# Patient Record
Sex: Female | Born: 1995 | Hispanic: Yes | Smoking: Never smoker
Health system: Southern US, Community
[De-identification: ages and names within clinical notes are randomized; demographics above are authoritative.]

## PROBLEM LIST (undated history)

## (undated) ENCOUNTER — Inpatient Hospital Stay (HOSPITAL_COMMUNITY): Payer: Self-pay

## (undated) DIAGNOSIS — N61 Mastitis without abscess: Secondary | ICD-10-CM

## (undated) DIAGNOSIS — R7611 Nonspecific reaction to tuberculin skin test without active tuberculosis: Secondary | ICD-10-CM

---

## 2014-01-25 ENCOUNTER — Encounter (HOSPITAL_COMMUNITY): Payer: Self-pay | Admitting: *Deleted

## 2014-01-25 ENCOUNTER — Emergency Department (HOSPITAL_COMMUNITY)
Admission: EM | Admit: 2014-01-25 | Discharge: 2014-01-25 | Disposition: A | Discharge status: Home / Self Care | Payer: Self-pay | Attending: Emergency Medicine | Admitting: Emergency Medicine

## 2014-01-25 DIAGNOSIS — Z3A24 24 weeks gestation of pregnancy: Secondary | ICD-10-CM | POA: Insufficient documentation

## 2014-01-25 DIAGNOSIS — O9989 Other specified diseases and conditions complicating pregnancy, childbirth and the puerperium: Secondary | ICD-10-CM | POA: Insufficient documentation

## 2014-01-25 DIAGNOSIS — O368120 Decreased fetal movements, second trimester, not applicable or unspecified: Secondary | ICD-10-CM | POA: Insufficient documentation

## 2014-01-25 DIAGNOSIS — R350 Frequency of micturition: Secondary | ICD-10-CM | POA: Insufficient documentation

## 2014-01-25 DIAGNOSIS — Z3490 Encounter for supervision of normal pregnancy, unspecified, unspecified trimester: Secondary | ICD-10-CM

## 2014-01-25 NOTE — ED Notes (Signed)
OB rapid response at bedside 

## 2014-01-25 NOTE — ED Notes (Signed)
Report received from ED, RN 

## 2014-01-25 NOTE — Progress Notes (Signed)
Pt is an 18 yo G2P1 previous c/section (in [place] with complaint of not feeling the baby move since yesterday. Pt had prenatal care in [place] until 2 months gestation. Has not had prenatal care in the [place]. [facility] but thinks her LMP was in March but she can not be any more specific with dates. States 1st pregnancy was delivered in [place] via c/section for "baby coming at 8 months" in 2012. Pt has very limited English but has friend at bedside helping to translate. Pt states she has not had anything to eat today. Baby is active now. FH via external monitor 145. No contractions noted on ext monitor or palpated. No ROM opr vag discharge.

## 2014-01-25 NOTE — ED Provider Notes (Signed)
CSN: [id number]     Arrival date & time 01/25/14  1045 History   First MD Initiated Contact with Patient 01/25/14 1053     Chief Complaint  Patient presents with  . Decreased Fetal Movement   HPI  Patient is an 18 y.o. G2P1 female who presents with the complaint of decreased fetal movements.  Patient thinks that she is approximately 6 months pregnant and has had no prenatal care since her confirmation examination.  Patient states that she has been feeling the baby move well until approximately 2 days ago.  She says that since that time she has not been feeling the baby move well.  She is also having some suprapubic pins and needles abdominal pain which she reports that she has been having frequently throughout her pregnancy.  Patient has had no vaginal bleeding, leakage of fluids, or contractions.  Patients previous pregnancy was in [place] and she had to have an emergency c-section performed due to fetal distress and decreased fetal movements.  Patient is taking a daily prenatal vitamin.  She has no other health problems.    History reviewed. No pertinent past medical history. Past Surgical History  Procedure Laterality Date  . Cesarean section      emergent at 8 mos.    History reviewed. No pertinent family history. History  Substance Use Topics  . Smoking status: Never Smoker   . Smokeless tobacco: Not on file  . Alcohol Use: No   OB History    Gravida Para Term Preterm AB TAB SAB Ectopic Multiple Living   2         1     Review of Systems  Constitutional: Negative for fever, chills and fatigue.  Respiratory: Negative for chest tightness and shortness of breath.   Cardiovascular: Negative for chest pain and palpitations.  Gastrointestinal: Negative for nausea, vomiting, diarrhea, constipation, blood in stool and anal bleeding.  Genitourinary: Positive for frequency. Negative for dysuria, urgency, hematuria, vaginal bleeding, vaginal discharge, difficulty urinating and vaginal  pain.  All other systems reviewed and are negative.     Allergies  Review of patient's allergies indicates no known allergies.  Home Medications   Prior to Admission medications   Medication Sig Start Date End Date Taking? Authorizing Provider  acetaminophen (TYLENOL) 325 MG tablet Take 325 mg by mouth every 8 (eight) hours as needed for headache.   Yes Historical Provider, MD  Prenatal Vit-Fe Fumarate-FA (PRENATAL PO) Take 1 tablet by mouth daily.   Yes Historical Provider, MD   BP 98/55 mmHg  Pulse 95  Temp(Src) 98.7 F (37.1 C) (Oral)  Resp 16  SpO2 99%  LMP 05/25/2013 Physical Exam  Constitutional: She is oriented to person, place, and time. She appears well-developed and well-nourished. No distress.  HENT:  Head: Normocephalic and atraumatic.  Mouth/Throat: Oropharynx is clear and moist. No oropharyngeal exudate.  Eyes: Conjunctivae and EOM are normal. Pupils are equal, round, and reactive to light. No scleral icterus.  Neck: Normal range of motion. Neck supple. No JVD present. No thyromegaly present.  Cardiovascular: Normal rate, regular rhythm, normal heart sounds and intact distal pulses.  Exam reveals no gallop and no friction rub.   No murmur heard. Pulmonary/Chest: Effort normal and breath sounds normal. No respiratory distress. She has no wheezes. She has no rales. She exhibits no tenderness.  Abdominal: Soft. Bowel sounds are normal. She exhibits no distension and no mass. There is no tenderness. There is no rebound and no guarding.  Gravid uterus with palpable uterine fundus.  Musculoskeletal: Normal range of motion.  Lymphadenopathy:    She has no cervical adenopathy.  Neurological: She is alert and oriented to person, place, and time. She has normal strength. No cranial nerve deficit or sensory deficit. Coordination normal.  Skin: Skin is warm and dry. She is not diaphoretic.  Psychiatric: She has a normal mood and affect. Her behavior is normal. Judgment and  thought content normal.  Nursing note and vitals reviewed.   ED Course  Procedures (including critical care time) Labs Review Labs Reviewed - No data to display  Imaging Review No results found.   EKG Interpretation None      MDM   Final diagnoses:  Pregnancy   Patient is an 18 y.o. Female who presents to the ED with decreased fetal movements.  Physical exam reveals gravid abdomen which is soft and non-tender.  Bedside ultrasound reveals fetal heart rate of 176 as performed by Dr. Gwendolyn GrantWalden.  Rapid OB was called and the patient was placed on the monitor.  Patient was observed for 30 minutes by Rapid OB with no evidence of contractions and good fetal heart rate.  Dr. Debroah LoopArnold was consulted via the rapid OB nurse who will discharge the patient and [facility]  Patient to be return for leakage of fluids, vaginal bleeding or any other concerning symptoms.  Patient also seen by Dr. Gwendolyn GrantWalden who agrees with the above plan and workup.      Eben Burowourtney A Forcucci, PA-C 01/25/14 1310

## 2014-01-25 NOTE — Progress Notes (Signed)
Pt eating Mal[place] sandwich and drinking apple juice. Baby active, FH 140 with spont accels and no decels. Sp[facility] to [facility]old [facility] . Pt to be discharged after eating. [facility]. Used [employer] line to speak to pt. She is aware of clinic appointment, and knows someone will be calling her to set up appointment. Pt has no questions or concerns at this time. Instructed pt in signs of labor, gave [facility].

## 2014-01-25 NOTE — ED Notes (Signed)
Per Marisue HumbleMaureen, RN OB rapid response advised [facility].  OK to discharge per Dr. Debroah LoopArnold.  Marisue HumbleMaureen, RN did all discharge instructions to pt via interpreter line.  PA made aware.

## 2014-01-25 NOTE — ED Notes (Signed)
Not felt baby move x 2 days; prev. Hx with first preg at 8 mos, whereby she had to have emergent c-section; pt. Have periumbilical pain - "pins and needles, and urinary frequency." she is 6 mos. Preg. Last time she saw an ob/gyn dr. Was during the first 2 mos. Of preg.

## 2014-01-25 NOTE — Discharge Instructions (Signed)
Evaluación de los movimientos fetales  °(Fetal Movement Counts) °Nombre del paciente: __________________________________________________ Fecha de parto estimada: ____________________ °La evaluación de los movimientos fetales es muy recomendable en los embarazos de alto riesgo, pero también es una buena idea que lo hagan todas las embarazadas. El médico le indicará que comience a contarlos a las 28 semanas de embarazo. Los movimientos fetales suelen aumentar:  °· Después de una comida completa. °· Después de la actividad física. °· Después de comer o beber algo dulce o frío. °· En reposo. °Preste atención cuando sienta que el bebé está más activo. Esto le ayudará a notar un patrón de ciclos de vigilia y sueño de su bebé y cuáles son los factores que contribuyen a un aumento de los movimientos fetales. Es importante llevar a cabo un recuento de movimientos fetales, al mismo tiempo cada día, cuando el bebé normalmente está más activo.  °CÓMO CONTAR LOS MOVIMIENTOS FETALES °1. Busque un lugar tranquilo y cómodo para sentarse o recostarse sobre el lado izquierdo. Al recostarse sobre su lado izquierdo, le proporciona una mejor circulación de sangre y oxígeno al bebé. °2. Anote el día y la hora en una hoja de papel o en un diario. °3. Comience contando las pataditas, revoloteos, chasquidos, vueltas o pinchazos en un período de 2 horas. Debe sentir al menos 10 movimientos en 2 horas. °4. Si no siente 10 movimientos en 2 horas, espere 2 ó 3 horas y cuente de nuevo. Busque cambios en el patrón o si no cuenta lo suficiente en 2 horas. °SOLICITE ATENCIÓN MÉDICA SI:  °· Siente menos de 10 pataditas en 2 horas, en dos intentos. °· No hay movimientos durante una hora. °· El patrón se modifica o le lleva más tiempo cada día contar las 10 pataditas. °· Siente que el bebé no se mueve como lo hace habitualmente. °Fecha: ____________ Movimientos: ____________ Hora de inicio: ____________ Hora de finalización: ____________  °Fecha:  ____________ Movimientos: ____________ Hora de inicio: ____________ Hora de finalización: ____________  °Fecha: ____________ Movimientos: ____________ Hora de inicio: ____________ Hora de finalización: ____________  °Fecha: ____________ Movimientos: ____________ Hora de inicio: ____________ Hora de finalización: ____________  °Fecha: ____________ Movimientos: ____________ Hora de inicio: ____________ Hora de finalización: ____________  °Fecha: ____________ Movimientos: ____________ Hora de inicio: ____________ Hora de finalización: ____________  °Fecha: ____________ Movimientos: ____________ Hora de inicio: ____________ Hora de finalización: ____________  °Fecha: ____________ Movimientos: ____________ Hora de inicio: ____________ Hora de finalización: ____________  °Fecha: ____________ Movimientos: ____________ Hora de inicio: ____________ Hora de finalización: ____________  °Fecha: ____________ Movimientos: ____________ Hora de inicio: ____________ Hora de finalización: ____________  °Fecha: ____________ Movimientos: ____________ Hora de inicio: ____________ Hora de finalización: ____________  °Fecha: ____________ Movimientos: ____________ Hora de inicio: ____________ Hora de finalización: ____________  °Fecha: ____________ Movimientos: ____________ Hora de inicio: ____________ Hora de finalización: ____________  °Fecha: ____________ Movimientos: ____________ Hora de inicio: ____________ Hora de finalización: ____________  °Fecha: ____________ Movimientos: ____________ Hora de inicio: ____________ Hora de finalización: ____________  °Fecha: ____________ Movimientos: ____________ Hora de inicio: ____________ Hora de finalización: ____________  °Fecha: ____________ Movimientos: ____________ Hora de inicio: ____________ Hora de finalización: ____________  °Fecha: ____________ Movimientos: ____________ Hora de inicio: ____________ Hora de finalización: ____________  °Fecha: ____________ Movimientos: ____________ Hora  de inicio: ____________ Hora de finalización: ____________  °Fecha: ____________ Movimientos: ____________ Hora de inicio: ____________ Hora de finalización: ____________  °Fecha: ____________ Movimientos: ____________ Hora de inicio: ____________ Hora de finalización: ____________  °Fecha: ____________ Movimientos: ____________ Hora de inicio: ____________ Hora de   finalización: ____________  °Fecha: ____________ Movimientos: ____________ Hora de inicio: ____________ Hora de finalización: ____________  °Fecha: ____________ Movimientos: ____________ Hora de inicio: ____________ Hora de finalización: ____________  °Fecha: ____________ Movimientos: ____________ Hora de inicio: ____________ Hora de finalización: ____________  °Fecha: ____________ Movimientos: ____________ Hora de inicio: ____________ Hora de finalización: ____________  °Fecha: ____________ Movimientos: ____________ Hora de inicio: ____________ Hora de finalización: ____________  °Fecha: ____________ Movimientos: ____________ Hora de inicio: ____________ Hora de finalización: ____________  °Fecha: ____________ Movimientos: ____________ Hora de inicio: ____________ Hora de finalización: ____________  °Fecha: ____________ Movimientos: ____________ Hora de inicio: ____________ Hora de finalización: ____________  °Fecha: ____________ Movimientos: ____________ Hora de inicio: ____________ Hora de finalización: ____________  °Fecha: ____________ Movimientos: ____________ Hora de inicio: ____________ Hora de finalización: ____________  °Fecha: ____________ Movimientos: ____________ Hora de inicio: ____________ Hora de finalización: ____________  °Fecha: ____________ Movimientos: ____________ Hora de inicio: ____________ Hora de finalización: ____________  °Fecha: ____________ Movimientos: ____________ Hora de inicio: ____________ Hora de finalización: ____________  °Fecha: ____________ Movimientos: ____________ Hora de inicio: ____________ Hora de finalización:  ____________  °Fecha: ____________ Movimientos: ____________ Hora de inicio: ____________ Hora de finalización: ____________  °Fecha: ____________ Movimientos: ____________ Hora de inicio: ____________ Hora de finalización: ____________  °Fecha: ____________ Movimientos: ____________ Hora de inicio: ____________ Hora de finalización: ____________  °Fecha: ____________ Movimientos: ____________ Hora de inicio: ____________ Hora de finalización: ____________  °Fecha: ____________ Movimientos: ____________ Hora de inicio: ____________ Hora de finalización: ____________  °Fecha: ____________ Movimientos: ____________ Hora de inicio: ____________ Hora de finalización: ____________  °Fecha: ____________ Movimientos: ____________ Hora de inicio: ____________ Hora de finalización: ____________  °Fecha: ____________ Movimientos: ____________ Hora de inicio: ____________ Hora de finalización: ____________  °Fecha: ____________ Movimientos: ____________ Hora de inicio: ____________ Hora de finalización: ____________  °Fecha: ____________ Movimientos: ____________ Hora de inicio: ____________ Hora de finalización: ____________  °Fecha: ____________ Movimientos: ____________ Hora de inicio: ____________ Hora de finalización: ____________  °Fecha: ____________ Movimientos: ____________ Hora de inicio: ____________ Hora de finalización: ____________  °Fecha: ____________ Movimientos: ____________ Hora de inicio: ____________ Hora de finalización: ____________  °Fecha: ____________ Movimientos: ____________ Hora de inicio: ____________ Hora de finalización: ____________  °Fecha: ____________ Movimientos: ____________ Hora de inicio: ____________ Hora de finalización: ____________  °Fecha: ____________ Movimientos: ____________ Hora de inicio: ____________ Hora de finalización: ____________  °Fecha: ____________ Movimientos: ____________ Hora de inicio: ____________ Hora de finalización: ____________  °Fecha: ____________  Movimientos: ____________ Hora de inicio: ____________ Hora de finalización: ____________  °Fecha: ____________ Movimientos: ____________ Hora de inicio: ____________ Hora de finalización: ____________  °Fecha: ____________ Movimientos: ____________ Hora de inicio: ____________ Hora de finalización: ____________  °Document Released: 06/18/2007 Document Revised: 02/26/2012 °[employer]® Patient Information ©2015 [employer]. This information is not intended to replace advice given to you by your health care provider. Make sure you discuss any questions you have with your health care provider. ° °

## 2014-01-25 NOTE — ED Notes (Signed)
Marisue HumbleMaureen, RN OB called with appt date/time.  [facility]  I will advise patient at discharge.

## 2014-02-02 ENCOUNTER — Encounter: Payer: Self-pay | Admitting: Advanced Practice Midwife

## 2014-02-02 ENCOUNTER — Ambulatory Visit (INDEPENDENT_AMBULATORY_CARE_PROVIDER_SITE_OTHER): Payer: Self-pay | Admitting: Advanced Practice Midwife

## 2014-02-02 VITALS — BP 106/64 | HR 84 | Temp 98.6°F | Wt 143.0 lb

## 2014-02-02 DIAGNOSIS — IMO0002 Reserved for concepts with insufficient information to code with codable children: Secondary | ICD-10-CM

## 2014-02-02 DIAGNOSIS — Z201 Contact with and (suspected) exposure to tuberculosis: Secondary | ICD-10-CM

## 2014-02-02 DIAGNOSIS — O34219 Maternal care for unspecified type scar from previous cesarean delivery: Secondary | ICD-10-CM

## 2014-02-02 DIAGNOSIS — O3421 Maternal care for scar from previous cesarean delivery: Secondary | ICD-10-CM

## 2014-02-02 DIAGNOSIS — O09899 Supervision of other high risk pregnancies, unspecified trimester: Secondary | ICD-10-CM | POA: Insufficient documentation

## 2014-02-02 DIAGNOSIS — O09892 Supervision of other high risk pregnancies, second trimester: Secondary | ICD-10-CM

## 2014-02-02 DIAGNOSIS — Z23 Encounter for immunization: Secondary | ICD-10-CM

## 2014-02-02 LAB — POCT URINALYSIS DIP (DEVICE)
BILIRUBIN URINE: NEGATIVE
GLUCOSE, UA: NEGATIVE mg/dL
HGB URINE DIPSTICK: NEGATIVE
Ketones, ur: NEGATIVE mg/dL
Nitrite: NEGATIVE
PH: 5.5 (ref 5.0–8.0)
Protein, ur: NEGATIVE mg/dL
Specific Gravity, Urine: 1.01 (ref 1.005–1.030)
Urobilinogen, UA: 0.2 mg/dL (ref 0.0–1.0)

## 2014-02-02 MED ORDER — TETANUS-DIPHTH-ACELL PERTUSSIS 5-2.5-18.5 LF-MCG/0.5 IM SUSP
0.5000 mL | Freq: Once | INTRAMUSCULAR | Status: AC
  Administered 2014-02-02: 0.5 mL via INTRAMUSCULAR

## 2014-02-02 NOTE — Progress Notes (Signed)
TB test administered 02/02/14. Patient advised to return Friday 02/04/14 between 9am-11am to have it read. Patient verbalized understanding and stated she would.

## 2014-02-02 NOTE — Progress Notes (Signed)
A: Pt. Here for first prenatal visit at 437w3d by LMP. [id number]. History of preterm delivery at 36 weeks via cesarean section for Oligohydramnios. She denies other complications in her previous pregnancy. She reports mild intermittent headaches at this point in her pregnancy, though she denies vision changes, nausea, vomiting, or abdominal pain. She denies dysuria or hematuria. She denies LOF, VB, Contractions. She reports good FM. She has no other medical problems, no other surgical history other than her previous c-section, No allergies, Does not take any medications at home other than tylenol, Lives at home with her husband and her mother in law who she says are both supportive, she does not smoke or drink alcohol. 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 She denies cough, night sweats, weight loss, or intermittent fevers at this point.   P:  - routine prenatal labs today - ppd for assessment of TB exposure due to lack of documentation.  - 1hr. GGT - Will schedule ultrasound today.  - Follow up in clinic in 2 weeks to review U/S, and test results.  - flu shot and Tdap today.

## 2014-02-02 NOTE — Progress Notes (Signed)
   Subjective:    Denise Avery is a G2P0101 1068w3d being seen today for her first obstetrical visit.  Her obstetrical history is significant for hx preterm delivery by C/S without labor for severe oligohydramnios.  Pt first delivery occurred [place] and was 34-36 weeks, pt is unsure.  Patient does intend to breast feed. Pregnancy history fully reviewed.  Pt had exposure to TB in [place] >1 year ago and had positive skin test shortly after this but with normal chest X-ray per pt.  Then, [place] the [place] She reports a negative PPD several months ago.  She denies fever, cough, recent weight loss, or night sweats.  Patient reports no complaints.  Filed Vitals:   02/02/14 0959  BP: 106/64  Pulse: 84  Temp: 98.6 F (37 C)  Weight: 143 lb (64.864 kg)    HISTORY: OB History  Gravida Para Term Preterm AB SAB TAB Ectopic Multiple Living  2 1 0 1 0 0 0 0 0 1     # Outcome Date GA Lbr Len/2nd Weight Sex Delivery Anes PTL Lv  2 Current           1 Preterm 12/05/10 2676w0d  4 lb (1.814 kg) F CS-Unspec Spinal  Y     Complications: Oligohydramnios     History reviewed. No pertinent past medical history. Past Surgical History  Procedure Laterality Date  . Cesarean section      emergent at 8 mos.    Family History  Problem Relation Age of Onset  . Diabetes Maternal Grandmother      Exam    Uterus:  Fundal Height: 26 cm  Pelvic Exam: Deferred r/t young age  System: Breast:  normal appearance, no masses or tenderness   Skin: normal coloration and turgor, no rashes    Neurologic: oriented, normal, gait normal; reflexes normal and symmetric   Extremities: normal strength, tone, and muscle mass, ROM of all joints is normal   HEENT neck supple with midline trachea, thyroid without masses and trachea midline   Mouth/Teeth mucous membranes moist, pharynx normal without lesions and dental hygiene good   Neck supple and no masses   Cardiovascular:    Respiratory:  appears well,  vitals normal, no respiratory distress, acyanotic, normal RR, ear and throat exam is normal, neck free of mass or lymphadenopathy   Abdomen: soft, non-tender; bowel sounds normal; no masses,  no organomegaly   Urinary: not evaluated      Assessment:    Pregnancy: G2P0101     1. Teen pregnancy   2. History of cesarean delivery affecting pregnancy   3. 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 Need for Tdap vaccination   5. Flu vaccine need       Plan:     Initial labs drawn. Prenatal vitamins. Problem list reviewed and updated. Genetic Screening discussed : late to care.  Ultrasound discussed; fetal survey: ordered.  Follow up in 2 weeks. 50% of 30 min visit spent on counseling and coordination of care.    I saw pt with resident and agree with note on 02/02/14.  LEFTWICH-KIRBY, Kalinda Romaniello 02/02/2014

## 2014-02-02 NOTE — Progress Notes (Signed)
Patient reports occasional sharp pain in belly

## 2014-02-03 ENCOUNTER — Ambulatory Visit (HOSPITAL_COMMUNITY)
Admission: RE | Admit: 2014-02-03 | Discharge: 2014-02-03 | Disposition: A | Discharge status: Home / Self Care | Payer: Self-pay | Source: Ambulatory Visit | Attending: Advanced Practice Midwife | Admitting: Advanced Practice Midwife

## 2014-02-03 DIAGNOSIS — IMO0002 Reserved for concepts with insufficient information to code with codable children: Secondary | ICD-10-CM

## 2014-02-03 DIAGNOSIS — Z3A26 26 weeks gestation of pregnancy: Secondary | ICD-10-CM | POA: Insufficient documentation

## 2014-02-03 DIAGNOSIS — Z1389 Encounter for screening for other disorder: Secondary | ICD-10-CM | POA: Insufficient documentation

## 2014-02-03 LAB — PRENATAL PROFILE (SOLSTAS)
ANTIBODY SCREEN: NEGATIVE
Basophils Absolute: 0 10*3/uL (ref 0.0–0.1)
Basophils Relative: 0 % (ref 0–1)
EOS ABS: 0.1 10*3/uL (ref 0.0–0.7)
EOS PCT: 1 % (ref 0–5)
HCT: 33.5 % — ABNORMAL LOW (ref 36.0–46.0)
HEMOGLOBIN: 11.6 g/dL — AB (ref 12.0–15.0)
HEP B S AG: NEGATIVE
HIV 1&2 Ab, 4th Generation: NONREACTIVE
Lymphocytes Relative: 17 % (ref 12–46)
Lymphs Abs: 1.9 10*3/uL (ref 0.7–4.0)
MCH: 30.5 pg (ref 26.0–34.0)
MCHC: 34.6 g/dL (ref 30.0–36.0)
MCV: 88.2 fL (ref 78.0–100.0)
MONOS PCT: 5 % (ref 3–12)
Monocytes Absolute: 0.5 10*3/uL (ref 0.1–1.0)
Neutro Abs: 8.4 10*3/uL — ABNORMAL HIGH (ref 1.7–7.7)
Neutrophils Relative %: 77 % (ref 43–77)
Platelets: 292 10*3/uL (ref 150–400)
RBC: 3.8 MIL/uL — AB (ref 3.87–5.11)
RDW: 14.3 % (ref 11.5–15.5)
RH TYPE: POSITIVE
RUBELLA: 6.64 {index} — AB (ref <0.90)
WBC: 10.9 10*3/uL — ABNORMAL HIGH (ref 4.0–10.5)

## 2014-02-03 LAB — PRESCRIPTION MONITORING PROFILE (19 PANEL)
AMPHETAMINE/METH: NEGATIVE ng/mL
Barbiturate Screen, Urine: NEGATIVE ng/mL
Benzodiazepine Screen, Urine: NEGATIVE ng/mL
Buprenorphine, Urine: NEGATIVE ng/mL
CANNABINOID SCRN UR: NEGATIVE ng/mL
CREATININE, URINE: 96.45 mg/dL (ref >20.0)
Carisoprodol, Urine: NEGATIVE ng/mL
Cocaine Metabolites: NEGATIVE ng/mL
FENTANYL URINE: NEGATIVE ng/mL
MDMA URINE: NEGATIVE ng/mL
Meperidine, Ur: NEGATIVE ng/mL
Methadone Screen, Urine: NEGATIVE ng/mL
Methaqualone: NEGATIVE ng/mL
Nitrites, Initial: NEGATIVE ug/mL
OXYCODONE SCRN UR: NEGATIVE ng/mL
Opiate Screen, Urine: NEGATIVE ng/mL
PH URINE, INITIAL: 5.4 pH (ref 4.5–8.9)
PHENCYCLIDINE, UR: NEGATIVE ng/mL
Propoxyphene: NEGATIVE ng/mL
TAPENTADOLUR: NEGATIVE ng/mL
Tramadol Scrn, Ur: NEGATIVE ng/mL
Zolpidem, Urine: NEGATIVE ng/mL

## 2014-02-03 LAB — GC/CHLAMYDIA PROBE AMP
CT Probe RNA: NEGATIVE
GC Probe RNA: NEGATIVE

## 2014-02-03 LAB — GLUCOSE TOLERANCE, 1 HOUR (50G) W/O FASTING: GLUCOSE 1 HOUR GTT: 93 mg/dL (ref 70–140)

## 2014-02-04 ENCOUNTER — Ambulatory Visit (HOSPITAL_COMMUNITY)
Admission: RE | Admit: 2014-02-04 | Discharge: 2014-02-04 | Disposition: A | Discharge status: Home / Self Care | Payer: Self-pay | Source: Ambulatory Visit | Attending: Advanced Practice Midwife | Admitting: Advanced Practice Midwife

## 2014-02-04 ENCOUNTER — Encounter: Payer: Self-pay | Admitting: General Practice

## 2014-02-04 DIAGNOSIS — R7611 Nonspecific reaction to tuberculin skin test without active tuberculosis: Secondary | ICD-10-CM

## 2014-02-04 LAB — HEMOGLOBINOPATHY EVALUATION
HEMOGLOBIN OTHER: 0 %
HGB A: 97.4 % (ref 96.8–97.8)
Hgb A2 Quant: 2.6 % (ref 2.2–3.2)
Hgb F Quant: 0 % (ref 0.0–2.0)
Hgb S Quant: 0 %

## 2014-02-04 LAB — CULTURE, OB URINE
Colony Count: NO GROWTH
Organism ID, Bacteria: NO GROWTH

## 2014-02-04 NOTE — Progress Notes (Signed)
Patient here today to have TB skin test read. Area of induration is 4.5x5cm slightly reddened and raised. Patient reports itching at site but denies productive cough, night sweats or fever. Chest x-ray ordered per protocol and patient walked upstairs to radiology.

## 2014-02-05 ENCOUNTER — Encounter (HOSPITAL_COMMUNITY): Payer: Self-pay | Admitting: *Deleted

## 2014-02-05 ENCOUNTER — Emergency Department (HOSPITAL_COMMUNITY)
Admission: EM | Admit: 2014-02-05 | Discharge: 2014-02-05 | Disposition: A | Discharge status: Home / Self Care | Payer: Self-pay | Attending: Emergency Medicine | Admitting: Emergency Medicine

## 2014-02-05 DIAGNOSIS — O26892 Other specified pregnancy related conditions, second trimester: Secondary | ICD-10-CM | POA: Insufficient documentation

## 2014-02-05 DIAGNOSIS — Z79899 Other long term (current) drug therapy: Secondary | ICD-10-CM | POA: Insufficient documentation

## 2014-02-05 DIAGNOSIS — Z3A26 26 weeks gestation of pregnancy: Secondary | ICD-10-CM | POA: Insufficient documentation

## 2014-02-05 DIAGNOSIS — R7611 Nonspecific reaction to tuberculin skin test without active tuberculosis: Secondary | ICD-10-CM | POA: Insufficient documentation

## 2014-02-05 HISTORY — DX: Nonspecific reaction to tuberculin skin test without active tuberculosis: R76.11

## 2014-02-05 MED ORDER — DIPHENHYDRAMINE HCL 25 MG PO CAPS
25.0000 mg | ORAL_CAPSULE | Freq: Once | ORAL | Status: AC
  Administered 2014-02-05: 25 mg via ORAL
  Filled 2014-02-05: qty 1

## 2014-02-05 MED ORDER — DIPHENHYDRAMINE HCL 25 MG PO CAPS: 25.0000 mg | ORAL_CAPSULE | Freq: Three times a day (TID) | ORAL | Status: DC | PRN

## 2014-02-05 MED ORDER — ACETAMINOPHEN 500 MG PO TABS
1000.0000 mg | ORAL_TABLET | Freq: Once | ORAL | Status: AC
  Administered 2014-02-05: 1000 mg via ORAL
  Filled 2014-02-05: qty 2

## 2014-02-05 NOTE — ED Provider Notes (Signed)
CSN: [id number]     Arrival date & time 02/05/14  0256 History   First MD Initiated Contact with Patient 02/05/14 [phone]     Chief Complaint  Patient presents with  . 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 It is roughly [redacted] weeks pregnant. Was seen on her first prenatal visit and had PPD placed on 11/11. Was read as positive on 11/13 with a diameter roughly 5 cm. It was itching at that point. She states she continues to have itching with pain that radiates up her arm. She's had no fever or chills. She denies any nausea or vomiting. Patient also had chest x-ray performed without any acute abnormalities. States that the redness around the site of the PPD placement has increased since it was read. Past Medical History  Diagnosis Date  . Positive TB test    Past Surgical History  Procedure Laterality Date  . Cesarean section      emergent at 8 mos.   . Cesarean section     Family History  Problem Relation Age of Onset  . Diabetes Maternal Grandmother    History  Substance Use Topics  . Smoking status: Never Smoker   . Smokeless tobacco: Never Used  . Alcohol Use: No   OB History    Gravida Para Term Preterm AB TAB SAB Ectopic Multiple Living   2 1 0 1 0 0 0 0 0 1      Review of Systems  Constitutional: Negative for fever and chills.  Respiratory: Negative for cough and shortness of breath.   Gastrointestinal: Negative for nausea and vomiting.  Musculoskeletal: Negative for back pain, neck pain and neck stiffness.  Skin: Positive for rash. Negative for wound.  Neurological: Negative for dizziness, weakness, light-headedness, numbness and headaches.  All other systems reviewed and are negative.     Allergies  Review of patient's allergies indicates no known allergies.  Home Medications   Prior to Admission medications    Medication Sig Start Date End Date Taking? Authorizing Provider  acetaminophen (TYLENOL) 325 MG tablet Take 325 mg by mouth every 8 (eight) hours as needed for headache.   Yes Historical Provider, MD  Prenatal Vit-Fe Fumarate-FA (PRENATAL PO) Take 1 tablet by mouth daily.    Historical Provider, MD   BP 101/54 mmHg  Pulse 87  Temp(Src) 97.7 F (36.5 C) (Oral)  Resp 20  Ht 4\' 9"  (1.448 m)  Wt 142 lb (64.411 kg)  BMI 30.72 kg/m2  SpO2 100%  LMP 08/01/2013 (Exact Date) Physical Exam  Constitutional: She is oriented to person, place, and time. She appears well-developed and well-nourished. No distress.  HENT:  Head: Normocephalic and atraumatic.  Mouth/Throat: Oropharynx is clear and moist.  Eyes: EOM are normal. Pupils are equal, round, and reactive to light.  Neck: Normal range of motion. Neck supple.  Cardiovascular: Normal rate and regular rhythm.   Pulmonary/Chest: Effort normal and breath sounds normal. No respiratory distress. She has no wheezes. She has no rales. She exhibits no tenderness.  Abdominal: Soft. Bowel sounds are normal. She exhibits no distension and no mass. There is no tenderness. There is no rebound and no guarding.  Musculoskeletal: Normal range of motion. She exhibits no edema or tenderness.  Neurological: She is alert and oriented to person, place, and time.  Skin: Skin is warm and dry. Rash noted. There is erythema.  Patient has a  1 cm in diameter area of induration surrounded by 6 cm in diameter area of erythema. Mild warmth. There is no fluctuance. No streaking.  Psychiatric: She has a normal mood and affect. Her behavior is normal.  Nursing note and vitals reviewed.   ED Course  Procedures (including critical care time) Labs Review Labs Reviewed - No data to display  Imaging Review Dg Chest 1 View  02/04/2014   CLINICAL DATA:  Positive TB skin test.  EXAM: CHEST - 1 VIEW  COMPARISON:  None.  FINDINGS: Mediastinum hilar structures normal. Lungs are  clear. Heart size normal. No pleural effusion or pneumothorax. Metallic densities noted over the right neck. This may be extrinsic to the patient.  IMPRESSION: No acute cardiopulmonary disease.   Electronically Signed   By: Maisie Fushomas  Register   On: 02/04/2014 12:29   [place] Ob Comp + 14 Wk  02/03/2014   OBSTETRICAL ULTRASOUND: This exam was performed within a [facility]. The OB US report was generated in the [facility] system, and faxed to the ordering physician.   This report is available in the [facility] [employer] PACS. See the [facility] Obstetric US report via the Image Link.    EKG Interpretation None      MDM   Final diagnoses:  None    Suspect this is likely just a positive PPD reaction. Patient had a negative chest x-ray. I doubt this is secondary infection. In the ED and have the patient return in 24 hours for reevaluation.the patient has been advised to take Tylenol and Benadryl as needed for pain and itching. She's been given return precautions and is voiced understanding    Loren Raceravid Annalisse Minkoff, MD 02/05/14 [phone]

## 2014-02-05 NOTE — ED Notes (Signed)
Pt had PPD TB test done Wednesday. Test was positive, [facility]., pt was not told the results. Pt reports pain at the site that radiates to right arm to right neck.

## 2014-02-05 NOTE — ED Notes (Signed)
Chest XR did not show any lesions on lungs. 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 Chest XR in [place] was also negative. Pt denies any symptoms for TB.

## 2014-02-05 NOTE — ED Notes (Signed)
Pt is also six months pregnant

## 2014-02-06 ENCOUNTER — Encounter (HOSPITAL_COMMUNITY): Payer: Self-pay | Admitting: Physical Medicine and Rehabilitation

## 2014-02-06 ENCOUNTER — Emergency Department (HOSPITAL_COMMUNITY)
Admission: EM | Admit: 2014-02-06 | Discharge: 2014-02-06 | Disposition: A | Discharge status: Home / Self Care | Payer: Self-pay | Attending: Emergency Medicine | Admitting: Emergency Medicine

## 2014-02-06 DIAGNOSIS — Z79899 Other long term (current) drug therapy: Secondary | ICD-10-CM | POA: Insufficient documentation

## 2014-02-06 DIAGNOSIS — O99712 Diseases of the skin and subcutaneous tissue complicating pregnancy, second trimester: Secondary | ICD-10-CM | POA: Insufficient documentation

## 2014-02-06 DIAGNOSIS — O9989 Other specified diseases and conditions complicating pregnancy, childbirth and the puerperium: Secondary | ICD-10-CM | POA: Insufficient documentation

## 2014-02-06 DIAGNOSIS — R7611 Nonspecific reaction to tuberculin skin test without active tuberculosis: Secondary | ICD-10-CM | POA: Insufficient documentation

## 2014-02-06 DIAGNOSIS — Z3A20 20 weeks gestation of pregnancy: Secondary | ICD-10-CM | POA: Insufficient documentation

## 2014-02-06 DIAGNOSIS — L02413 Cutaneous abscess of right upper limb: Secondary | ICD-10-CM | POA: Insufficient documentation

## 2014-02-06 NOTE — ED Provider Notes (Signed)
CSN: [id number]     Arrival date & time 02/06/14  1225 History  This chart was scribed for non-physician practitioner Santiago GladHeather Ameli Sangiovanni, PA-C working with Toy BakerAnthony T Allen, MD by Annye AsaAnna Dorsett, ED Scribe. This patient was seen in room TR04C/TR04C and the patient's care was started at 1:30 PM.    Chief Complaint  Patient presents with  . Abscess   The history is provided by the patient. A language interpreter was used.     HPI Comments: Denise Avery is a 18 y.o. female who presents to the Emergency Department for recheck. Patient explains that she was here yesterday to have her positive TB test checked; she was given prescriptions for Benadryl and was told to return today for further check. She reports that the area is less swollen than it was yesterday. She had a CXR yesterday that was negative.  She denies cough, night sweats, fever, chills, abdominal pain, vaginal discharge, vaginal bleeding.  She is currently [redacted] weeks pregnant.    [place] has been in the [place] about [place].  [place] moved [place] from [place]    Past Medical History  Diagnosis Date  . Positive TB test    Past Surgical History  Procedure Laterality Date  . Cesarean section      emergent at 8 mos.   . Cesarean section     Family History  Problem Relation Age of Onset  . Diabetes Maternal Grandmother    History  Substance Use Topics  . Smoking status: Never Smoker   . Smokeless tobacco: Never Used  . Alcohol Use: No   OB History    Gravida Para Term Preterm AB TAB SAB Ectopic Multiple Living   2 1 0 1 0 0 0 0 0 1      Review of Systems  Constitutional: Negative for fever and chills.  Gastrointestinal: Negative for nausea, vomiting and abdominal pain.  Genitourinary: Negative for vaginal discharge and vaginal pain.  All other systems reviewed and are negative.   Allergies  Review of patient's allergies indicates no known allergies.  Home Medications   Prior to Admission medications   Medication  Sig Start Date End Date Taking? Authorizing Provider  acetaminophen (TYLENOL) 325 MG tablet Take 325 mg by mouth every 8 (eight) hours as needed for headache.    Historical Provider, MD  diphenhydrAMINE (BENADRYL) 25 mg capsule Take 1 capsule (25 mg total) by mouth every 8 (eight) hours as needed for itching. 02/05/14   Loren Raceravid Yelverton, MD  Prenatal Vit-Fe Fumarate-FA (PRENATAL PO) Take 1 tablet by mouth daily.    Historical Provider, MD   BP 107/55 mmHg  Pulse 72  Temp(Src) 97.6 F (36.4 C) (Oral)  Resp 18  Ht 5' (1.524 m)  Wt 142 lb (64.411 kg)  BMI 27.73 kg/m2  SpO2 99%  LMP 08/01/2013 (Exact Date) Physical Exam  Constitutional: She is oriented to person, place, and time. She appears well-developed and well-nourished.  HENT:  Head: Normocephalic and atraumatic.  Neck: No tracheal deviation present.  Cardiovascular: Normal rate.   2+ radial pulses  Pulmonary/Chest: Effort normal.  Neurological: She is alert and oriented to person, place, and time.  Distal sensation intact in all five fingers on the right hand  Skin: Skin is warm and dry.  20 mm area of erythema and induration on the right forearm; No erythema or edema surrounding this.  no erythematous streaking.  Area of erythema significantly improved when compared to the area that was marked with skin  marking pen yesterday.  Psychiatric: She has a normal mood and affect. Her behavior is normal.  Nursing note and vitals reviewed.   ED Course  Procedures   DIAGNOSTIC STUDIES: Oxygen Saturation is 99% on RA, normal by my interpretation.    COORDINATION OF CARE: 1:40 PM Discussed treatment plan with pt at bedside and pt agreed to plan.   Labs Review Labs Reviewed - No data to display  Imaging Review No results found.   EKG Interpretation None     2:01 PM Discussed with Infectious Disease.  They recommend having the patient follow up at the [facility].  Nothing further to do in the ED. MDM   Final  diagnoses:  None   Patient presenting with a positive PPD test.  She had a CXR yesterday that was negative.  Patient is currently pregnant, but denies abdominal pain, vaginal bleeding, or vaginal discharge.  Discussed PPD results with ID.  They recommend having the patient follow up with the Health Department and do not feel that there is anything further to do in the ED.  Patient stable for discharge.  Return precautions given,     Santiago GladHeather [place] Kreitzer, PA-C 02/06/14 2204  Toy BakerAnthony T Allen, MD 02/07/14 [phone]

## 2014-02-06 NOTE — ED Notes (Addendum)
Pt was seen yesterday for positive TB test, had chest x-ray performed, was negative. Pt returns to ED today for severe itching and discomfort at site. Red rash noted to inner R forearm. Pt does not speak english.

## 2014-02-10 ENCOUNTER — Encounter: Payer: Self-pay | Admitting: Advanced Practice Midwife

## 2014-02-10 DIAGNOSIS — Z201 Contact with and (suspected) exposure to tuberculosis: Secondary | ICD-10-CM | POA: Insufficient documentation

## 2014-02-16 ENCOUNTER — Ambulatory Visit (INDEPENDENT_AMBULATORY_CARE_PROVIDER_SITE_OTHER): Payer: Self-pay | Admitting: Physician Assistant

## 2014-02-16 VITALS — BP 103/63 | HR 79 | Temp 97.8°F | Wt 145.6 lb

## 2014-02-16 DIAGNOSIS — IMO0002 Reserved for concepts with insufficient information to code with codable children: Secondary | ICD-10-CM

## 2014-02-16 LAB — POCT URINALYSIS DIP (DEVICE)
Bilirubin Urine: NEGATIVE
GLUCOSE, UA: NEGATIVE mg/dL
HGB URINE DIPSTICK: NEGATIVE
Ketones, ur: NEGATIVE mg/dL
NITRITE: NEGATIVE
PH: 5.5 (ref 5.0–8.0)
Protein, ur: NEGATIVE mg/dL
Specific Gravity, Urine: 1.01 (ref 1.005–1.030)
UROBILINOGEN UA: 0.2 mg/dL (ref 0.0–1.0)

## 2014-02-16 MED ORDER — PRENATAL VITAMINS PLUS 27-1 MG PO TABS: 1.0000 | ORAL_TABLET | Freq: Every day | ORAL | Status: DC

## 2014-02-16 NOTE — Patient Instructions (Signed)
Informacin sobre el parto prematuro  (Preterm Labor Information)  Se llama parto prematuro cuando se inicia antes de las 37 semanas de embarazo. La duracin de un embarazo normal es de 39 a 41 semanas.  CAUSAS  Generalmente las causas del parto prematuro no se conocen. La causa ms frecuente conocida es una infeccin.  FACTORES DE RIESGO   Historia previa de parto prematuro.  Romper la bolsa de aguas antes de tiempo.  La placenta cubre la abertura del cuello.  La placenta se despega del tero.  El cuello es demasiado dbil para contener al beb en el tero.  Hay mucho lquido en el saco amnitico.  Consumo de drogas o hbito de fumar durante el embarazo.  No aumentar de peso lo suficiente durante el embarazo.  Mujeres menores de 18 aos o mayores de 35 aos.  Tener bajos ingresos.  Pertenecer a la raza afroamericana. SNTOMAS   Clicos similares a los menstruales, dolor en el vientre (abdominal) o dolor en la espalda.  Contracciones regulares, tan frecuentes como seis en una hora. Pueden ser suaves o dolorosas.  Contracciones que comienzan en la parte superior del vientre. Luego bajan hacia la zona inferior del vientre y hacia la espalda.  Presin en la zona inferior del vientre que parece empeorar.  Sangrado que proviene de la vagina.  Prdida de lquido por la vagina. TRATAMIENTO  El tratamiento depende de:   Su estado.  El estado del beb.  Cuntas semanas tiene de embarazo. El mdico podr indicarle:   Medicamentos para detener las contracciones.  Que permanezca en la cama excepto para ir al bao (reposo en cama).  Que permanezca en el hospital. QU DEBE HACER SI PIENSA QUE EST EN TRABAJO DE PARTO PREMATURO?  Comunquese con su mdico de inmediato. Debe concurrir al hospital para ser controlada inmediatamente.  CMO PUEDE EVITAR EL TRABAJO DE PARTO PREMATURO EN FUTUROS EMBARAZOS?   Si fuma, abandone el hbito.  Mantenga un aumento de peso  saludable.  Notome drogas ni manipule sustancias qumicas que no necesita.  Informe a su mdico si piensa que tiene una infeccin.  Informe a su mdico si tuvo un trabajo de parto prematuro anteriormente. Document Released: 04/13/2010 Document Revised: 11/11/2012 [employer] Patient Information 2015 [employer]. This information is not intended to replace advice given to you by your health care provider. Make sure you discuss any questions you have with your health care provider.  

## 2014-02-16 NOTE — Progress Notes (Signed)
Used Spanish interpreter Raquel

## 2014-02-16 NOTE — Progress Notes (Signed)
28 weeks, no complaints.  Denies LOF, vaginal bleeding, dysuria.  Endorses good fetal movement PTL precautions reviewed RTC 2 weeks

## 2014-02-18 LAB — URINE CULTURE: Colony Count: 30000

## 2014-03-02 ENCOUNTER — Ambulatory Visit (INDEPENDENT_AMBULATORY_CARE_PROVIDER_SITE_OTHER): Payer: Self-pay | Admitting: Physician Assistant

## 2014-03-02 VITALS — BP 107/56 | HR 79 | Temp 97.7°F | Wt 145.7 lb

## 2014-03-02 DIAGNOSIS — Z3492 Encounter for supervision of normal pregnancy, unspecified, second trimester: Secondary | ICD-10-CM

## 2014-03-02 LAB — POCT URINALYSIS DIP (DEVICE)
Bilirubin Urine: NEGATIVE
Glucose, UA: NEGATIVE mg/dL
HGB URINE DIPSTICK: NEGATIVE
Ketones, ur: NEGATIVE mg/dL
Nitrite: NEGATIVE
PROTEIN: NEGATIVE mg/dL
UROBILINOGEN UA: 0.2 mg/dL (ref 0.0–1.0)
pH: 5 (ref 5.0–8.0)

## 2014-03-02 NOTE — Patient Instructions (Signed)
Segundo trimestre de embarazo (Second Trimester of Pregnancy) El segundo trimestre va desde la semana13 hasta la 28, desde el cuarto hasta el sexto mes, y suele ser el momento en el que mejor se siente. Su organismo se ha adaptado a estar embarazada y comienza a sentirse fsicamente mejor. En general, las nuseas matutinas han disminuido o han desaparecido completamente, p El segundo trimestre es tambin la poca en la que el feto se desarrolla rpidamente. Hacia el final del sexto mes, el feto mide aproximadamente 9pulgadas (23cm) y pesa alrededor de 1 libras (700g). Es probable que sienta que el beb se mueve (da pataditas) entre las 18 y 20semanas del embarazo. CAMBIOS EN EL ORGANISMO Su organismo atraviesa por muchos cambios durante el embarazo, y estos varan de una mujer a otra.  Seguir aumentando de peso. Notar que la parte baja del abdomen sobresale. Podrn aparecer las primeras estras en las caderas, el abdomen y las mamas. Es posible que tenga dolores de cabeza que pueden aliviarse con los medicamentos que su mdico autorice. Tal vez tenga necesidad de orinar con ms frecuencia porque el feto est ejerciendo presin sobre la vejiga. Debido al embarazo podr sentir acidez estomacal con frecuencia. Puede estar estreida, ya que ciertas hormonas enlentecen los movimientos de los msculos que empujan los desechos a travs de los intestinos. Pueden aparecer hemorroides o abultarse e hincharse las venas (venas varicosas). Puede tener dolor de espalda que se debe al aumento de peso y a que las hormonas del embarazo relajan las articulaciones entre los huesos de la pelvis, y como consecuencia de la modificacin del peso y los msculos que mantienen el equilibrio. Las mamas seguirn creciendo y le dolern. Las encas pueden sangrar y estar sensibles al cepillado y al hilo dental. Pueden aparecer zonas oscuras o manchas (cloasma, mscara del embarazo) en el rostro que probablemente se  atenuarn despus del nacimiento del beb. Es posible que se forme una lnea oscura desde el ombligo hasta la zona del pubis (linea nigra) que probablemente se atenuarn despus del nacimiento del beb. Tal vez haya cambios en el cabello que pueden incluir su engrosamiento, crecimiento rpido y cambios en la textura. Adems, a algunas mujeres se les cae el cabello durante o despus del embarazo, o tienen el cabello seco o fino. Lo ms probable es que el cabello se le normalice despus del nacimiento del beb. QU DEBE ESPERAR EN LAS CONSULTAS PRENATALES Durante una visita prenatal de rutina: La pesarn para asegurarse de que usted y el feto estn creciendo normalmente. Le tomarn la presin arterial. Le medirn el abdomen para controlar el desarrollo del beb. Se escucharn los latidos cardacos fetales. Se evaluarn los resultados de los estudios solicitados en visitas anteriores. El mdico puede preguntarle lo siguiente: Cmo se siente. Si siente los movimientos del beb. Si ha tenido sntomas anormales, como prdida de lquido, sangrado, dolores de cabeza intensos o clicos abdominales. Si tiene alguna pregunta. Otros estudios que podrn realizarse durante el segundo trimestre incluyen lo siguiente: Anlisis de sangre para detectar: Concentraciones de hierro bajas (anemia). Diabetes gestacional (entre la semana 24 y la 28). Anticuerpos Rh. Anlisis de orina para detectar infecciones, diabetes o protenas en la orina. Una ecografa para confirmar que el beb crece y se desarrolla correctamente. Una amniocentesis para diagnosticar posibles problemas genticos. Estudios del feto para descartar espina bfida y sndrome de Down. INSTRUCCIONES PARA EL CUIDADO EN EL HOGAR  Evite fumar, consumir hierbas, beber alcohol y tomar frmacos que no le hayan recetado. Estas sustancias qumicas   afectan la formacin y el desarrollo del beb. Siga las indicaciones del mdico en relacin con el uso de  medicamentos. Durante el embarazo, hay medicamentos que son seguros de tomar y otros que no. Haga actividad fsica solo en la forma indicada por el mdico. Sentir clicos uterinos es un buen signo para detener la actividad fsica. Contine comiendo alimentos que sanos con regularidad. Use un sostn que le brinde buen soporte si le duelen las mamas. No se d baos de inmersin en agua caliente, baos turcos ni saunas. Colquese el cinturn de seguridad cuando conduzca. No coma carne cruda ni queso sin cocinar; evite el contacto con las bandejas sanitarias de los gatos y la tierra que estos animales usan. Estos elementos contienen grmenes que pueden causar defectos congnitos en el beb. Tome las vitaminas prenatales. Si est estreida, pruebe un laxante suave (si el mdico lo autoriza). Consuma ms alimentos ricos en fibra, como vegetales y frutas frescos y cereales integrales. Beba gran cantidad de lquido para mantener la orina de tono claro o color amarillo plido. Dese baos de asiento con agua tibia para aliviar el dolor o las molestias causadas por las hemorroides. Use una crema para las hemorroides si el mdico la autoriza. Si tiene venas varicosas, use medias de descanso. Eleve los pies durante 15minutos, 3 o 4veces por da. Limite la cantidad de sal en su dieta. No levante objetos pesados, use zapatos de tacones bajos y mantenga una buena postura. Descanse con las piernas elevadas si tiene calambres o dolor de cintura. Visite a su dentista si an no lo ha hecho durante el embarazo. Use un cepillo de dientes blando para higienizarse los dientes y psese el hilo dental con suavidad. Puede seguir manteniendo relaciones sexuales, a menos que el mdico le indique lo contrario. Concurra a todas las visitas prenatales segn las indicaciones de su mdico. SOLICITE ATENCIN MDICA SI:  Tiene mareos. Siente clicos leves, presin en la pelvis o dolor persistente en el abdomen. Tiene nuseas,  vmitos o diarrea persistentes. Tiene secrecin vaginal con mal olor. Siente dolor al orinar. SOLICITE ATENCIN MDICA DE INMEDIATO SI:  Tiene fiebre. Tiene una prdida de lquido por la vagina. Tiene sangrado o pequeas prdidas vaginales. Siente dolor intenso o clicos en el abdomen. Sube o baja de peso rpidamente. Tiene dificultad para respirar y siente dolor de pecho. Sbitamente se le hinchan mucho el rostro, las manos, los tobillos, los pies o las piernas. No ha sentido los movimientos del beb durante una hora. Siente un dolor de cabeza intenso que no se alivia con medicamentos. Hay cambios en la visin. Document Released: 12/19/2004 Document Revised: 03/16/2013 [employer] Patient Information 2015 [employer]. This information is not intended to replace advice given to you by your health care provider. Make sure you discuss any questions you have with your health care provider. Acidez de estmago durante el embarazo  (Heartburn During Pregnancy ) La acidez es la sensacin de ardor en el pecho que se siente cuando el cido del estmago vuelve haca el esfago. La acidez es frecuente en el embarazo debido a la liberacin de cierta hormona (progesterona). La progesterona relaja la vlvula que separa el esfago del estmago. Esto hace que el cido suba al esfago y cause acidez. Tambin puede ocurrir en el embarazo debido a que el tero al agrandarse empuja el estmago, lo que hace que suba ms cido al esfago. Esto se produce especialmente en las ltimas etapas del embarazo. La acidez generalmente desaparece despus del parto. CAUSAS  La   acidez se siente cuando el cido del estmago vuelve hacia el esfago. Durante el embarazo, puede ser causada por distintas cosas, por ejemplo:  La hormona progesterona. Cambios en los niveles hormonales. El tero crece y empuja el cido del estmago hacia arriba. Comidas abundantes Ciertos alimentos y bebidas Haga actividad fsica. Aumento en la  produccin de cido SIGNOS Y SNTOMAS  Sensacin de ardor en el pecho o en la parte inferior de la garganta. Sabor amargo en la boca. Tos. DIAGNSTICO  El mdico diagnostica la acidez con una historia clnica cuidadosa en la que pregunta por sus molestias. Le indicar anlisis de sangre para encontrar cierto tipo de bacteria que se asocia con la acidez. En algunos casos se diagnostica recetando un medicamento para calmar la acidez y viendo si los sntomas mejoran. En algunos casos, se realiza un procedimiento llamado endoscopa. En este procedimiento se usa un tubo con una luz y una cmara en un extremo (endoscopio) , y se examina el esfago y el estmago. TRATAMIENTO  El tratamiento variar segn la gravedad de los sntomas. El mdico podr indicar: Medicamentos de venta libre (anticidos, medicamentos para disminuir la acidez) en los casos de acidez leve. Medicamentos recetados para disminuir el cido estomacal o para proteger la superficie del estmago. Ciertos cambios en la dieta. Elevacin de la cabecera de la cama colocando bloques debajo de las patas. De esta manera evitar que el cido del estmago vuelva al esfago mientras est recostado. INSTRUCCIONES PARA EL CUIDADO EN EL HOGAR  Tome slo medicamentos de venta libre o recetados, segn las indicaciones del mdico. Eleve la cabecera de la cama colocando bloques debajo de las patas, si el mdico lo aconsej. Usar ms almohadas al dormir no es efectivo porque solo modificara la posicin de la cabeza. No  haga ejercicios enseguida despus de comer. Evite comer 2 o 3horas antes de irse a dormir. No se acueste enseguida despus de comer. Haga comidas pequeas durante el da en lugar de tres comidas abundantes. Identifique los alimentos o las bebidas que empeoran sus sntomas y evtelos. Los alimentos que debe evitar son: Pimienta. Chocolate. Alimentos con alto contenido de grasas, incluyendo las comidas fritas Comidas muy  condimentadas. Ajo y cebolla Ctricos, como naranja, pomelo, limn y lima Alimentos o productos que contengan tomate Menta. Bebidas gaseosas y con cafena. Vinagre SOLICITE ATENCIN MDICA SI: Tiene cualquier tipo de dolor abdominal. Siente ardor en la parte superior del abdomen o el pecho, especialmente despus de comer o mientras est acostada. Tiene nuseas o vmitos. Siente malestar estomacal despus de comer. SOLICITE ATENCIN MDICA DE INMEDIATO SI:  Siente un dolor intenso en el pecho que baja por el brazo o va hacia al mandbula o el cuello. Se siente mareado o sufre un desmayo. Comienza a sentir falta de aire. Vomita sangre. Tiene dificultad o dolor al tragar. La materia fecal es negra, de aspecto alquitranado. Tiene acidez ms de 3 veces por semana, durante ms de 2 semanas. ASEGRESE DE QUE: Comprende estas instrucciones. Controlar su afeccin. Recibir ayuda de inmediato si no mejora o si empeora. Document Released: 12/19/2004 Document Revised: 12/30/2012 ExitCare Patient Information 2015 [employer]. This information is not intended to replace advice given to you by your health care provider. Make sure you discuss any questions you have with your health care provider.    3 veces por semana, durante ms de 2 semanas. ASEGRESE DE QUE:  Comprende estas instrucciones.  Controlar su afeccin.  Recibir ayuda de inmediato si no mejora o si empeora. Document Released: 12/19/2004 Document Revised: 12/30/2012 [facility] Patient Information 2015 [place], [place]. This information is not intended to replace advice given to you by your health care provider. Make sure you discuss any questions you have with your health care provider.

## 2014-03-02 NOTE — Progress Notes (Signed)
Patient reports heartburn; used [employer] for interpreter

## 2014-03-02 NOTE — Progress Notes (Signed)
30 weeks, stable.  Endorses good fetal movement.  Denies LOF, vaginal bleeding, dysuria.  Complains of heartburn Discussed VBAC and she chooses to think about it.  Tums advised for heartburn RTC 2 weeks

## 2014-03-04 LAB — URINE CULTURE: Colony Count: 15000

## 2014-03-16 ENCOUNTER — Ambulatory Visit (INDEPENDENT_AMBULATORY_CARE_PROVIDER_SITE_OTHER): Payer: Self-pay | Admitting: Obstetrics & Gynecology

## 2014-03-16 VITALS — BP 104/65 | HR 87 | Wt 145.8 lb

## 2014-03-16 DIAGNOSIS — IMO0002 Reserved for concepts with insufficient information to code with codable children: Secondary | ICD-10-CM

## 2014-03-16 DIAGNOSIS — O34219 Maternal care for unspecified type scar from previous cesarean delivery: Secondary | ICD-10-CM

## 2014-03-16 DIAGNOSIS — O3421 Maternal care for scar from previous cesarean delivery: Secondary | ICD-10-CM

## 2014-03-16 LAB — POCT URINALYSIS DIP (DEVICE)
Bilirubin Urine: NEGATIVE
GLUCOSE, UA: NEGATIVE mg/dL
HGB URINE DIPSTICK: NEGATIVE
Ketones, ur: NEGATIVE mg/dL
Nitrite: NEGATIVE
Protein, ur: NEGATIVE mg/dL
Urobilinogen, UA: 0.2 mg/dL (ref 0.0–1.0)
pH: 5.5 (ref 5.0–8.0)

## 2014-03-16 NOTE — Progress Notes (Signed)
Routine visit. Good FM. No problems. We discussed TOLAC. Due to her age I have encouraged TOLAC and discussed risks of both options. She will again consider.

## 2014-03-25 NOTE — L&D Delivery Note (Addendum)
Delivery Note At 7:28 PM a viable female was delivered via VBAC, Spontaneous (Presentation: Right Occiput Anterior).  APGAR: 9,9 ; weight pending .   Placenta status: Intact, Spontaneous.  Cord:  with the following complications: none .  Cord pH: n/a  Anesthesia: Epidural  Episiotomy: None Lacerations: None Suture Repair: n/a Est. Blood Loss (mL): 300  Mom to postpartum.  Baby to Couplet care / Skin to Skin.  William DaltonMcEachern, Ezriel Boffa 05/09/2014, 7:52 PM

## 2014-03-31 ENCOUNTER — Ambulatory Visit (INDEPENDENT_AMBULATORY_CARE_PROVIDER_SITE_OTHER): Payer: Self-pay | Admitting: Obstetrics and Gynecology

## 2014-03-31 ENCOUNTER — Encounter: Payer: Self-pay | Admitting: Obstetrics and Gynecology

## 2014-03-31 VITALS — BP 112/58 | HR 88 | Temp 97.4°F | Wt 151.2 lb

## 2014-03-31 DIAGNOSIS — Z3493 Encounter for supervision of normal pregnancy, unspecified, third trimester: Secondary | ICD-10-CM

## 2014-03-31 LAB — POCT URINALYSIS DIP (DEVICE)
BILIRUBIN URINE: NEGATIVE
Glucose, UA: NEGATIVE mg/dL
Hgb urine dipstick: NEGATIVE
KETONES UR: NEGATIVE mg/dL
NITRITE: NEGATIVE
PH: 6 (ref 5.0–8.0)
Protein, ur: NEGATIVE mg/dL
Specific Gravity, Urine: 1.015 (ref 1.005–1.030)
Urobilinogen, UA: 0.2 mg/dL (ref 0.0–1.0)

## 2014-03-31 NOTE — Progress Notes (Signed)
Interpreter here. Doing well; RLP discussed. Denies upper abd pain, UCs.  Still desires TOLAC. Reviewed P1. Had decreased FM, oligo [place] Baby went [place] in 2 days. Plans reviewed. Cultures next.

## 2014-03-31 NOTE — Patient Instructions (Signed)
Third Trimester of Pregnancy The third trimester is from week 29 through week 42, months 7 through 9. The third trimester is a time when the fetus is growing rapidly. At the end of the ninth month, the fetus is about 20 inches in length and weighs 6-10 pounds.  BODY CHANGES Your body goes through many changes during pregnancy. The changes vary from woman to woman.   Your weight will continue to increase. You can expect to gain 25-35 pounds (11-16 kg) by the end of the pregnancy.  You may begin to get stretch marks on your hips, abdomen, and breasts.  You may urinate more often because the fetus is moving lower into your pelvis and pressing on your bladder.  You may develop or continue to have heartburn as a result of your pregnancy.  You may develop constipation because certain hormones are causing the muscles that push waste through your intestines to slow down.  You may develop hemorrhoids or swollen, bulging veins (varicose veins).  You may have pelvic pain because of the weight gain and pregnancy hormones relaxing your joints between the bones in your pelvis. Backaches may result from overexertion of the muscles supporting your posture.  You may have changes in your hair. These can include thickening of your hair, rapid growth, and changes in texture. Some women also have hair loss during or after pregnancy, or hair that feels dry or thin. Your hair will most likely return to normal after your baby is born.  Your breasts will continue to grow and be tender. A yellow discharge may leak from your breasts called colostrum.  Your belly button may stick out.  You may feel short of breath because of your expanding uterus.  You may notice the fetus "dropping," or moving lower in your abdomen.  You may have a bloody mucus discharge. This usually occurs a few days to a week before labor begins.  Your cervix becomes thin and soft (effaced) near your due date. WHAT TO EXPECT AT YOUR PRENATAL  EXAMS  You will have prenatal exams every 2 weeks until week 36. Then, you will have weekly prenatal exams. During a routine prenatal visit:  You will be weighed to make sure you and the fetus are growing normally.  Your blood pressure is taken.  Your abdomen will be measured to track your baby's growth.  The fetal heartbeat will be listened to.  Any test results from the previous visit will be discussed.  You may have a cervical check near your due date to see if you have effaced. At around 36 weeks, your caregiver will check your cervix. At the same time, your caregiver will also perform a test on the secretions of the vaginal tissue. This test is to determine if a type of bacteria, Group B streptococcus, is present. Your caregiver will explain this further. Your caregiver may ask you:  What your birth plan is.  How you are feeling.  If you are feeling the baby move.  If you have had any abnormal symptoms, such as leaking fluid, bleeding, severe headaches, or abdominal cramping.  If you have any questions. Other tests or screenings that may be performed during your third trimester include:  Blood tests that check for low iron levels (anemia).  Fetal testing to check the health, activity level, and growth of the fetus. Testing is done if you have certain medical conditions or if there are problems during the pregnancy. FALSE LABOR You may feel small, irregular contractions that   eventually go away. These are called Braxton Hicks contractions, or false labor. Contractions may last for hours, days, or even weeks before true labor sets in. If contractions come at regular intervals, intensify, or become painful, it is best to be seen by your caregiver.  SIGNS OF LABOR   Menstrual-like cramps.  Contractions that are 5 minutes apart or less.  Contractions that start on the top of the uterus and spread down to the lower abdomen and back.  A sense of increased pelvic pressure or back  pain.  A watery or bloody mucus discharge that comes from the vagina. If you have any of these signs before the 37th week of pregnancy, call your caregiver right away. You need to go to the hospital to get checked immediately. HOME CARE INSTRUCTIONS   Avoid all smoking, herbs, alcohol, and unprescribed drugs. These chemicals affect the formation and growth of the baby.  Follow your caregiver's instructions regarding medicine use. There are medicines that are either safe or unsafe to take during pregnancy.  Exercise only as directed by your caregiver. Experiencing uterine cramps is a good sign to stop exercising.  Continue to eat regular, healthy meals.  Wear a good support bra for breast tenderness.  Do not use hot tubs, steam rooms, or saunas.  Wear your seat belt at all times when driving.  Avoid raw meat, uncooked cheese, cat litter boxes, and soil used by cats. These carry germs that can cause birth defects in the baby.  Take your prenatal vitamins.  Try taking a stool softener (if your caregiver approves) if you develop constipation. Eat more high-fiber foods, such as fresh vegetables or fruit and whole grains. Drink plenty of fluids to keep your urine clear or pale yellow.  Take warm sitz baths to soothe any pain or discomfort caused by hemorrhoids. Use hemorrhoid cream if your caregiver approves.  If you develop varicose veins, wear support hose. Elevate your feet for 15 minutes, 3-4 times a day. Limit salt in your diet.  Avoid heavy lifting, wear low heal shoes, and practice good posture.  Rest a lot with your legs elevated if you have leg cramps or low back pain.  Visit your dentist if you have not gone during your pregnancy. Use a soft toothbrush to brush your teeth and be gentle when you floss.  A sexual relationship may be continued unless your caregiver directs you otherwise.  Do not travel far distances unless it is absolutely necessary and only with the approval  of your caregiver.  Take prenatal classes to understand, practice, and ask questions about the labor and delivery.  Make a trial run to the hospital.  Pack your hospital bag.  Prepare the baby's nursery.  Continue to go to all your prenatal visits as directed by your caregiver. SEEK MEDICAL CARE IF:  You are unsure if you are in labor or if your water has broken.  You have dizziness.  You have mild pelvic cramps, pelvic pressure, or nagging pain in your abdominal area.  You have persistent nausea, vomiting, or diarrhea.  You have a bad smelling vaginal discharge.  You have pain with urination. SEEK IMMEDIATE MEDICAL CARE IF:   You have a fever.  You are leaking fluid from your vagina.  You have spotting or bleeding from your vagina.  You have severe abdominal cramping or pain.  You have rapid weight loss or gain.  You have shortness of breath with chest pain.  You notice sudden or extreme swelling   of your face, hands, ankles, feet, or legs.  You have not felt your baby move in over an hour.  You have severe headaches that do not go away with medicine.  You have vision changes. Document Released: 03/05/2001 Document Revised: 03/16/2013 Document Reviewed: 05/12/2012 Exit[employer] Patient Information 2015 [employer]. This information is not intended to replace advice given to you by your health care provider. Make sure you discuss any questions you have with your health care provider.  

## 2014-03-31 NOTE — Progress Notes (Signed)
Pt complains of pressure in abdomen, from epigastric area to vaginal area.  Denise Avery present as Research officer, trade unionpanish Interpreter for encounter.

## 2014-04-13 ENCOUNTER — Ambulatory Visit (INDEPENDENT_AMBULATORY_CARE_PROVIDER_SITE_OTHER): Payer: Self-pay | Admitting: Advanced Practice Midwife

## 2014-04-13 VITALS — BP 113/66 | HR 77 | Wt 153.7 lb

## 2014-04-13 DIAGNOSIS — O3421 Maternal care for scar from previous cesarean delivery: Secondary | ICD-10-CM

## 2014-04-13 DIAGNOSIS — O34219 Maternal care for unspecified type scar from previous cesarean delivery: Secondary | ICD-10-CM

## 2014-04-13 DIAGNOSIS — Z201 Contact with and (suspected) exposure to tuberculosis: Secondary | ICD-10-CM

## 2014-04-13 LAB — OB RESULTS CONSOLE GC/CHLAMYDIA
Chlamydia: NEGATIVE
GC PROBE AMP, GENITAL: NEGATIVE

## 2014-04-13 LAB — OB RESULTS CONSOLE GBS: STREP GROUP B AG: NEGATIVE

## 2014-04-13 NOTE — Addendum Note (Signed)
Addended by: Sherre LainASH, AMANDA A on: 04/13/2014 01:42 PM   Modules accepted: Orders

## 2014-04-13 NOTE — Progress Notes (Signed)
Doing well.  Good fetal movement, denies vaginal bleeding, LOF, regular contractions.  VBAC risks/benefits discussed. Consent signed and witnessed.

## 2014-04-14 LAB — GC/CHLAMYDIA PROBE AMP
CT Probe RNA: NEGATIVE
GC PROBE AMP APTIMA: NEGATIVE

## 2014-04-15 LAB — CULTURE, BETA STREP (GROUP B ONLY)

## 2014-04-18 ENCOUNTER — Encounter: Payer: Self-pay | Admitting: *Deleted

## 2014-04-20 ENCOUNTER — Encounter: Payer: Self-pay | Admitting: Advanced Practice Midwife

## 2014-04-20 ENCOUNTER — Ambulatory Visit (INDEPENDENT_AMBULATORY_CARE_PROVIDER_SITE_OTHER): Payer: Self-pay | Admitting: Advanced Practice Midwife

## 2014-04-20 VITALS — BP 108/60 | HR 89 | Temp 97.8°F | Wt 154.6 lb

## 2014-04-20 DIAGNOSIS — Z3493 Encounter for supervision of normal pregnancy, unspecified, third trimester: Secondary | ICD-10-CM

## 2014-04-20 DIAGNOSIS — IMO0002 Reserved for concepts with insufficient information to code with codable children: Secondary | ICD-10-CM

## 2014-04-20 LAB — POCT URINALYSIS DIP (DEVICE)
BILIRUBIN URINE: NEGATIVE
Glucose, UA: NEGATIVE mg/dL
KETONES UR: NEGATIVE mg/dL
NITRITE: NEGATIVE
PROTEIN: NEGATIVE mg/dL
SPECIFIC GRAVITY, URINE: 1.01 (ref 1.005–1.030)
UROBILINOGEN UA: 0.2 mg/dL (ref 0.0–1.0)
pH: 5 (ref 5.0–8.0)

## 2014-04-20 NOTE — Progress Notes (Signed)
Used Interpreter Dorita Arias.  

## 2014-04-20 NOTE — Patient Instructions (Signed)
Parto vaginal (Vaginal Delivery) Durante el parto, el mdico la ayudar a dar a luz a su beb. En elparto vaginal, deber pujar para que el beb salga por la vagina. Sin embargo, antes de que pueda sacar al beb, es necesario que ocurran ciertas cosas. La abertura del tero (cuello del tero) tiene que ablandarse, hacerse ms delgado y abrirse (dilatar) hasta que llegue a 10 cm. Adems, el beb tiene que bajar desde el tero a la vagina. SIGNOS DE TRABAJO DE PARTO  El mdico tendr primero que asegurarse de que usted est en trabajo de parto. Algunos signos son:   Eliminar lo que se llama tapn mucoso antes del inicio del trabajo de parto. Este es una pequea cantidad de mucosidad teida con sangre.  Tener contracciones uterinas regulares y dolorosas.   El tiempo entre las contracciones debe acortarse  Las molestias y el dolor se harn ms intensos gradualmente.  El dolor de las contracciones empeora al caminar y no se alivia con el reposo.   El cuello del tero se hace mas delgado (se borra) y se dilata. ANTES DEL PARTO Una vez que se inicie el trabajo de parto y sea admitida en el hospital o sanatorio, el mdico podr hacer lo siguiente:   Realizar un examen fsico.  Controlar si hay complicaciones relacionadas con el trabajo de parto.  Verificar su presin arterial, temperatura y pulso y la frecuencia cardaca (signos vitales).   Determinar si se ha roto el saco amnitico y cundo ha ocurrido.  Realizar un examen vaginal (utilizando un guante estril y un lubricante) para determinar:  La posicin (presentacin) del beb. El beb se presenta con la cabeza primero (vertex) en el canal de parto (vagina), o estn los pies o las nalgas primero (de nalgas)?  El nivel (estacin) de la cabeza del beb dentro del canal de parto.  El borramiento y la dilatacin del cuello uterino  El monitor fetal electrnico generalmente se coloca sobre el abdomen al llegar. Se utiliza para  controlar las contracciones y la frecuencia cardaca del beb.  Cuando el monitor est en el abdomen (monitor fetal externo), slo toma la frecuencia y la duracin de las contracciones. No informa acerca de la intensidad de las contracciones.  Si el mdico necesita saber exactamente la intensidad de las contracciones o cul es la frecuencia cardaca del beb, colocar un monitor interno en la vagina y el tero. El mdico comentar los riesgos y los beneficios de usar un monitor interno y le pedir autorizacin antes de colocar el dispositivo.  El monitoreo fetal continuo ser necesario si le han aplicado una epidural, si le administran ciertos medicamentos (como oxitocina) y si tiene complicaciones del embarazo o del trabajo de parto.  Podrn colocarle una va intravenosa en una vena del brazo para suministrarle lquidos y medicamentos, si es necesario. TRES ETAPAS DEL TRABAJO DE PARTO Y EL PARTO El trabajo de parto y el parto normales se dividen en tres etapas. Primera etapa Esta etapa comienza cuando comienzan las contracciones regulares y el cuello comienza a borrarse y dilatarse. Finaliza cuando el cuello est completamente abierto (completamente dilatado). La primera etapa es la etapa ms larga del trabajo de parto y puede durar desde 3 horas a 15 horas.  Algunos mtodos estn disponibles para ayudar con el dolor del parto. Usted y su mdico decidirn qu opcin es la mejor para usted. Las opciones incluyen:   Medicamentos narcticos. Estos son medicamentos fuertes que usted puede recibir a travs de una va intravenosa o   como inyeccin en el msculo. Estos medicamentos alivian el dolor pero no hacen que desaparezca completamente.  Epidural. Se administra un medicamento a travs de un tubo delgado que se inserta en la espalda. El medicamento adormece la parte inferior del cuerpo y evita el dolor en esa zona.  Bloqueo paracervical Es una inyeccin de un anestsico en cada lado del cuello  uterino.  Usted podr pedir un parto natural, que implica que no se usen analgsicos ni epidural durante el parto y el trabajo de parto. En cambio, podr tener otro tipo de ayuda como ejercicios respiratorios para hacer frente al dolor. Segunda etapa La segunda etapa del trabajo de parto comienza cuando el cuello se ha dilatado completamente a 10 cm. Contina hasta que usted puja al beb hacia abajo, por el canal de parto, y el beb nace. Esta etapa puede durar slo algunos minutos o algunas horas.  La posicin del la cabeza del beb a medida que pasa por el canal de parto, es informada como un nmero, llamado estacin. Si la cabeza del beb no ha iniciado su descenso, la estacin se describe como que est en menos 3 (-3). Cuando la cabeza del beb est en la estacin cero, est en el medio del canal de parto y se encaja en la pelvis. La estacin en la que se encuentra el beb indica el progreso de la segunda etapa del trabajo de parto.  Cuando el beb nace, el mdico lo sostendr con la cabeza hacia abajo para evitar que el lquido amnitico, el moco y la sangre entren en los pulmones del beb. La boca y la nariz del beb podrn ser succionadas con un pequeo bulbo para retirar todo lquido adicional.  El mdico podr colocar al beb sobre su estmago. Es importante evitar que el beb tome fro. Para hacerlo, el mdico secar al beb, lo colocar directamente sobre su piel, (sin mantas entre usted y el beb) y lo cubrir con mantas secas y tibias.  Se corta el cordn umbilical. Tercera etapa Durante la tercera etapa del trabajo de parto, el mdico sacar la placenta (alumbramiento) y se asegurar de que el sangrado est controlado. La salida de la placenta generalmente demora 5 minutos pero puede tardar hasta 30 minutos. Luego de la salida de la placenta, le darn un medicamento por va intravenosa o inyectable para ayudar a contraer el tero y controlar el sangrado. Si planea amamantar al beb,  puede intentar en este momento Luego de la salida de la placenta, el tero debe contraerse y quedar muy firme. Si el tero no queda firme, el mdico lo masajear. Esto es importante debido a que la contraccin del tero ayuda a cortar el sangrado en el sitio en que la placenta estaba unida al tero. Si el tero no se contrae adecuadamente ni permanece firme, podr causar un sangrado abundante. Si hay mucho sangrado, podrn darle medicamentos para contraer el tero y detener el sangrado.  Document Released: 02/22/2008 Document Revised: 07/26/2013 ExitCare Patient Information 2015 [employer]. This information is not intended to replace advice given to you by your health care provider. Make sure you discuss any questions you have with your health care provider.  

## 2014-04-20 NOTE — Progress Notes (Signed)
Doing well. Discussed lab results. GBS negative.  Illene BolusLori Clemmons CNM

## 2014-04-27 ENCOUNTER — Ambulatory Visit (INDEPENDENT_AMBULATORY_CARE_PROVIDER_SITE_OTHER): Payer: Self-pay | Admitting: Advanced Practice Midwife

## 2014-04-27 VITALS — BP 105/62 | HR 82 | Wt 154.0 lb

## 2014-04-27 DIAGNOSIS — IMO0002 Reserved for concepts with insufficient information to code with codable children: Secondary | ICD-10-CM

## 2014-04-27 LAB — POCT URINALYSIS DIP (DEVICE)
Bilirubin Urine: NEGATIVE
Glucose, UA: NEGATIVE mg/dL
Hgb urine dipstick: NEGATIVE
Ketones, ur: NEGATIVE mg/dL
NITRITE: NEGATIVE
PROTEIN: NEGATIVE mg/dL
SPECIFIC GRAVITY, URINE: 1.015 (ref 1.005–1.030)
Urobilinogen, UA: 0.2 mg/dL (ref 0.0–1.0)
pH: 5 (ref 5.0–8.0)

## 2014-04-27 NOTE — Progress Notes (Signed)
Spanish Interpreter: Alviris present for check in.

## 2014-04-27 NOTE — Progress Notes (Signed)
Doing well.  Good fetal movement, denies vaginal bleeding, LOF, regular contractions.  Reviewed signs of labor.  Alviris, hospital interpreter, used for all communication.

## 2014-05-04 ENCOUNTER — Encounter: Payer: Self-pay | Admitting: Physician Assistant

## 2014-05-04 NOTE — Progress Notes (Signed)
Opened in error.  Pt DNKA.

## 2014-05-08 ENCOUNTER — Encounter (HOSPITAL_COMMUNITY): Payer: Self-pay | Admitting: *Deleted

## 2014-05-08 ENCOUNTER — Inpatient Hospital Stay (HOSPITAL_COMMUNITY)
Admission: AD | Admit: 2014-05-08 | Discharge: 2014-05-08 | Disposition: A | Discharge status: Home / Self Care | Payer: Self-pay | Source: Ambulatory Visit | Attending: Obstetrics and Gynecology | Admitting: Obstetrics and Gynecology

## 2014-05-08 DIAGNOSIS — O34219 Maternal care for unspecified type scar from previous cesarean delivery: Secondary | ICD-10-CM

## 2014-05-08 DIAGNOSIS — O368130 Decreased fetal movements, third trimester, not applicable or unspecified: Secondary | ICD-10-CM | POA: Insufficient documentation

## 2014-05-08 DIAGNOSIS — Z3A40 40 weeks gestation of pregnancy: Secondary | ICD-10-CM | POA: Insufficient documentation

## 2014-05-08 DIAGNOSIS — Z201 Contact with and (suspected) exposure to tuberculosis: Secondary | ICD-10-CM

## 2014-05-08 DIAGNOSIS — IMO0002 Reserved for concepts with insufficient information to code with codable children: Secondary | ICD-10-CM

## 2014-05-08 NOTE — Progress Notes (Signed)
Assisted RN with interpretation of discharge instructions for patient. Spanish interpreter

## 2014-05-08 NOTE — Progress Notes (Signed)
Assisted Admissions with interpretation of registration of patient.  Also assisted RN with interpretation of patient assessment. Spanish Intperpreter

## 2014-05-08 NOTE — Discharge Instructions (Signed)
Informacin sobre la prueba de Lucasvilletrabajo de parto despus de Neomia Dearuna cesrea (Trial of Labor After Cesarean Delivery Information) La prueba de Bellemeadetrabajo de parto despus de una cesrea (TOLAC por sus siglas en ingls) es cuando una mujer trata de dar a luz por va vaginal despus de una cesrea anterior. La TOLAC puede ser Neomia Dearuna opcin segura y [place] Islandsadecuada segn la historia clnica y otros factores de [place]go. Cuando TOLAC tiene xito y es capaz de tener un parto vaginal, esto se llama un parto vaginal despus de Neomia Dearuna cesrea (PVDC).  CANDIDATAS PARA TOLAC Este procedimiento es posible para algunas mujeres que:  Hayan sido sometidas a uno o dos partos por cesrea en los que la incisin del tero fue horizontal (transversal baja).  Estn esperando gemelos y tuvieron una incisin transversal baja durante una cesrea.  No tienen una cicatriz uterina vertical (clsica).  No han tenido una ruptura en la pared del tero (ruptura uterina). El TOLAC tambin puede intentarse en mujeres que cumplen con los criterios apropiados:  Son menores de [address] aos.  Son altas y tienen un ndice de masa corporal [facility] inferior a 30.  . Fredderick Phenixienen una cicatriz uterina desconocida.  Dan a luz en un centro equipado para realizar un parto por cesrea de emergencia. Este equipo debe ser capaz de Company secretarymanejar las posibles complicaciones, como una ruptura uterina.  Tienen asesoramiento completo United Stationerssobre los beneficios y riesgos del TOLAC.  Han comentado acerca de futuros planes de embarazo con su mdico.  Han planificado tener varios embarazos ms. CANDIDATAS A TENER MS XITO EN TOLAC:  Han tenido un parto vaginal exitoso antes o despus de su parto por cesrea.  Experimentan un trabajo de parto que comienza, naturalmente, en o antes de la fecha estimada (40 semanas de gestacin).  El beb no es muy grande ( macrosmico).   Han tenido un parto por cesrea anterior, pero actualmente no hay factores que promoveran un parto por cesrea  (como una posicin de [place]).  Han tenido un solo parto por cesrea anteriormente.  Tuvo un parto por cesrea antes de realizar el Susanvilletrabajo de parto y no despus de Teacher, English as a foreign languagela dilatacin completa. TOLAC puede ser ms apropiado para mujeres que cumplen con las normas anteriores y que planean tener ms embarazos. No se recomienda en partos domiciliarios. CANDIDATAS A TENER MENOS XITO EN TOLAC:  Tienen un parto inducido con un cuello uterino desfavorable. Un cuello uterino desfavorable es cuando no se dilata lo suficiente (entre otros factores).  Nunca han tenido un parto vaginal.  Han tenido ms de dos partos por cesrea.  Tienen un embarazo de ms de 40 semanas de gestacin.  Est embarazada de un un beb con sospecha de un peso mayor de 4.000 gramos (8  libras) y no tiene antecedentes de un parto vaginal.  Han tenido embarazos muy cercanos. BENEFICIOS SUGERIDOS DE LA TOLAC  Tiempo de recuperacin ms rpido.  Permanencia ms breve en el hospital.  Menos dolor y problemas que en un parto por cesrea. Las AK Steel Holding Corporationmujeres que tienen un parto por cesrea tienen una mayor probabilidad de necesitar sangre o tener fiebre, una infeccin o un cogulo sanguneo en las piernas. RIESGOS SUGERIDOS DE LA TOLAC El riesgo ms alto de complicaciones ocurre en mujeres que intentan un TOLAC y fracasan. Una TOLAC que fracasa resulta en una cesrea no planificada. Los riesgos relacionados con TOLAC o cesreas repetidas son:   Lulu Ridingrdida de sangre.  Infeccin.  Cogulos sanguneos.  Lesiones en los rganos o tejidos circundantes.  Menor riesgo de  remocin del tero (histerectoma).  Posibles problemas con la placenta (como placenta previa o placenta acreta) en embarazos futuros. Aunque es muy raro, las preocupaciones principales con el TOLAC son:  Ruptura de la cicatriz uterina de una cesrea anterior.  Necesidad de una cesrea de emergencia.  Tener un mal resultado para el beb (morbilidad  perinatal). PARA OBTENER MS INFORMACIN American College of Obstetricians and Gynecologists (Colegio Estadounidense de Obstetras y Gineclogos): www.acog.org American College of Nurse-Midwives (Colegio Estadounidense de Enfermeras - parteras): www.midwife.org Document Released: 02/28/2011 Document Revised: 12/30/2012 ExitCare Patient Information 2015 ExitCare[employer]. This information is not intended to replace advice given to you by your health care provider. Make sure you discuss any questions you have with your health care provider.  

## 2014-05-09 ENCOUNTER — Inpatient Hospital Stay (HOSPITAL_COMMUNITY): Payer: Medicaid Other | Admitting: Anesthesiology

## 2014-05-09 ENCOUNTER — Inpatient Hospital Stay (HOSPITAL_COMMUNITY)
Admission: AD | Admit: 2014-05-09 | Discharge: 2014-05-11 | DRG: 775 | Disposition: A | Discharge status: Home / Self Care | Payer: Medicaid Other | Source: Ambulatory Visit | Attending: Obstetrics & Gynecology | Admitting: Obstetrics & Gynecology

## 2014-05-09 ENCOUNTER — Encounter (HOSPITAL_COMMUNITY): Payer: Self-pay | Admitting: *Deleted

## 2014-05-09 DIAGNOSIS — Z201 Contact with and (suspected) exposure to tuberculosis: Secondary | ICD-10-CM

## 2014-05-09 DIAGNOSIS — O480 Post-term pregnancy: Principal | ICD-10-CM | POA: Diagnosis present

## 2014-05-09 DIAGNOSIS — IMO0002 Reserved for concepts with insufficient information to code with codable children: Secondary | ICD-10-CM

## 2014-05-09 DIAGNOSIS — Z3A40 40 weeks gestation of pregnancy: Secondary | ICD-10-CM | POA: Diagnosis present

## 2014-05-09 DIAGNOSIS — O34219 Maternal care for unspecified type scar from previous cesarean delivery: Secondary | ICD-10-CM

## 2014-05-09 DIAGNOSIS — Z3483 Encounter for supervision of other normal pregnancy, third trimester: Secondary | ICD-10-CM | POA: Diagnosis present

## 2014-05-09 DIAGNOSIS — IMO0001 Reserved for inherently not codable concepts without codable children: Secondary | ICD-10-CM

## 2014-05-09 LAB — CBC
HCT: 35.4 % — ABNORMAL LOW (ref 36.0–46.0)
Hemoglobin: 12 g/dL (ref 12.0–15.0)
MCH: 30.1 pg (ref 26.0–34.0)
MCHC: 33.9 g/dL (ref 30.0–36.0)
MCV: 88.7 fL (ref 78.0–100.0)
Platelets: 235 10*3/uL (ref 150–400)
RBC: 3.99 MIL/uL (ref 3.87–5.11)
RDW: 14.9 % (ref 11.5–15.5)
WBC: 15.3 10*3/uL — ABNORMAL HIGH (ref 4.0–10.5)

## 2014-05-09 LAB — ABO/RH: ABO/RH(D): O POS

## 2014-05-09 LAB — TYPE AND SCREEN
ABO/RH(D): O POS
ANTIBODY SCREEN: NEGATIVE

## 2014-05-09 MED ORDER — METHYLERGONOVINE MALEATE 0.2 MG PO TABS: 0.2000 mg | ORAL_TABLET | ORAL | Status: DC | PRN

## 2014-05-09 MED ORDER — FENTANYL 2.5 MCG/ML BUPIVACAINE 1/10 % EPIDURAL INFUSION (WH - ANES)
INTRAMUSCULAR | Status: AC
  Administered 2014-05-09: 14 mL/h via EPIDURAL
  Filled 2014-05-09: qty 125

## 2014-05-09 MED ORDER — LACTATED RINGERS IV SOLN
500.0000 mL | INTRAVENOUS | Status: DC | PRN
Start: 2014-05-09 — End: 2014-05-09

## 2014-05-09 MED ORDER — PHENYLEPHRINE 40 MCG/ML (10ML) SYRINGE FOR IV PUSH (FOR BLOOD PRESSURE SUPPORT)
80.0000 ug | PREFILLED_SYRINGE | INTRAVENOUS | Status: DC | PRN
  Filled 2014-05-09: qty 2

## 2014-05-09 MED ORDER — ONDANSETRON HCL 4 MG/2ML IJ SOLN: 4.0000 mg | Freq: Four times a day (QID) | INTRAMUSCULAR | Status: DC | PRN

## 2014-05-09 MED ORDER — OXYTOCIN 40 UNITS IN LACTATED RINGERS INFUSION - SIMPLE MED: 62.5000 mL/h | INTRAVENOUS | Status: DC

## 2014-05-09 MED ORDER — ONDANSETRON HCL 4 MG PO TABS: 4.0000 mg | ORAL_TABLET | ORAL | Status: DC | PRN

## 2014-05-09 MED ORDER — OXYTOCIN 40 UNITS IN LACTATED RINGERS INFUSION - SIMPLE MED
1.0000 m[IU]/min | INTRAVENOUS | Status: DC
  Administered 2014-05-09: 2 m[IU]/min via INTRAVENOUS
  Filled 2014-05-09: qty 1000

## 2014-05-09 MED ORDER — EPHEDRINE 5 MG/ML INJ
10.0000 mg | INTRAVENOUS | Status: DC | PRN
  Filled 2014-05-09: qty 2

## 2014-05-09 MED ORDER — ONDANSETRON HCL 4 MG/2ML IJ SOLN: 4.0000 mg | INTRAMUSCULAR | Status: DC | PRN

## 2014-05-09 MED ORDER — FENTANYL CITRATE 0.05 MG/ML IJ SOLN
100.0000 ug | INTRAMUSCULAR | Status: DC | PRN
  Filled 2014-05-09 (×2): qty 2

## 2014-05-09 MED ORDER — LANOLIN HYDROUS EX OINT: TOPICAL_OINTMENT | CUTANEOUS | Status: DC | PRN

## 2014-05-09 MED ORDER — ZOLPIDEM TARTRATE 5 MG PO TABS: 5.0000 mg | ORAL_TABLET | Freq: Every evening | ORAL | Status: DC | PRN

## 2014-05-09 MED ORDER — OXYTOCIN BOLUS FROM INFUSION
500.0000 mL | INTRAVENOUS | Status: DC
  Administered 2014-05-09: 500 mL via INTRAVENOUS

## 2014-05-09 MED ORDER — OXYCODONE-ACETAMINOPHEN 5-325 MG PO TABS
1.0000 | ORAL_TABLET | ORAL | Status: DC | PRN
  Administered 2014-05-10 – 2014-05-11 (×3): 1 via ORAL
  Filled 2014-05-09 (×3): qty 1

## 2014-05-09 MED ORDER — TETANUS-DIPHTH-ACELL PERTUSSIS 5-2.5-18.5 LF-MCG/0.5 IM SUSP: 0.5000 mL | Freq: Once | INTRAMUSCULAR | Status: DC

## 2014-05-09 MED ORDER — MISOPROSTOL 200 MCG PO TABS
1000.0000 ug | ORAL_TABLET | Freq: Once | ORAL | Status: AC
  Administered 2014-05-09: 1000 ug via RECTAL
  Filled 2014-05-09: qty 5

## 2014-05-09 MED ORDER — LACTATED RINGERS IV SOLN
500.0000 mL | Freq: Once | INTRAVENOUS | Status: AC
  Administered 2014-05-09: 1000 mL via INTRAVENOUS

## 2014-05-09 MED ORDER — METHYLERGONOVINE MALEATE 0.2 MG/ML IJ SOLN
0.2000 mg | INTRAMUSCULAR | Status: DC | PRN
  Administered 2014-05-09: 0.2 mg via INTRAMUSCULAR

## 2014-05-09 MED ORDER — WITCH HAZEL-GLYCERIN EX PADS: 1.0000 "application " | MEDICATED_PAD | CUTANEOUS | Status: DC | PRN

## 2014-05-09 MED ORDER — MEASLES, MUMPS & RUBELLA VAC ~~LOC~~ INJ
0.5000 mL | INJECTION | Freq: Once | SUBCUTANEOUS | Status: DC
  Filled 2014-05-09: qty 0.5

## 2014-05-09 MED ORDER — OXYCODONE-ACETAMINOPHEN 5-325 MG PO TABS: 2.0000 | ORAL_TABLET | ORAL | Status: DC | PRN

## 2014-05-09 MED ORDER — DIPHENHYDRAMINE HCL 50 MG/ML IJ SOLN: 12.5000 mg | INTRAMUSCULAR | Status: DC | PRN

## 2014-05-09 MED ORDER — SENNOSIDES-DOCUSATE SODIUM 8.6-50 MG PO TABS
2.0000 | ORAL_TABLET | ORAL | Status: DC
  Administered 2014-05-10 (×2): 2 via ORAL
  Filled 2014-05-09 (×2): qty 2

## 2014-05-09 MED ORDER — DIPHENHYDRAMINE HCL 25 MG PO CAPS: 25.0000 mg | ORAL_CAPSULE | Freq: Four times a day (QID) | ORAL | Status: DC | PRN

## 2014-05-09 MED ORDER — IBUPROFEN 600 MG PO TABS
600.0000 mg | ORAL_TABLET | Freq: Four times a day (QID) | ORAL | Status: DC
  Administered 2014-05-10 – 2014-05-11 (×7): 600 mg via ORAL
  Filled 2014-05-09 (×7): qty 1

## 2014-05-09 MED ORDER — PRENATAL MULTIVITAMIN CH
1.0000 | ORAL_TABLET | Freq: Every day | ORAL | Status: DC
  Administered 2014-05-10 – 2014-05-11 (×2): 1 via ORAL
  Filled 2014-05-09 (×2): qty 1

## 2014-05-09 MED ORDER — LIDOCAINE HCL (PF) 1 % IJ SOLN
INTRAMUSCULAR | Status: DC | PRN
  Administered 2014-05-09 (×2): 5 mL

## 2014-05-09 MED ORDER — TERBUTALINE SULFATE 1 MG/ML IJ SOLN
0.2500 mg | Freq: Once | INTRAMUSCULAR | Status: DC | PRN
  Filled 2014-05-09: qty 1

## 2014-05-09 MED ORDER — PHENYLEPHRINE 40 MCG/ML (10ML) SYRINGE FOR IV PUSH (FOR BLOOD PRESSURE SUPPORT)
PREFILLED_SYRINGE | INTRAVENOUS | Status: AC
  Filled 2014-05-09: qty 20

## 2014-05-09 MED ORDER — LIDOCAINE HCL (PF) 1 % IJ SOLN
30.0000 mL | INTRAMUSCULAR | Status: DC | PRN
  Filled 2014-05-09: qty 30

## 2014-05-09 MED ORDER — LACTATED RINGERS IV SOLN
INTRAVENOUS | Status: DC
  Administered 2014-05-09 (×2): via INTRAVENOUS

## 2014-05-09 MED ORDER — FENTANYL CITRATE 0.05 MG/ML IJ SOLN
100.0000 ug | Freq: Once | INTRAMUSCULAR | Status: AC
  Administered 2014-05-09: 100 ug via INTRAVENOUS

## 2014-05-09 MED ORDER — ACETAMINOPHEN 325 MG PO TABS: 650.0000 mg | ORAL_TABLET | ORAL | Status: DC | PRN

## 2014-05-09 MED ORDER — BENZOCAINE-MENTHOL 20-0.5 % EX AERO
1.0000 "application " | INHALATION_SPRAY | CUTANEOUS | Status: DC | PRN
  Administered 2014-05-10: 1 via TOPICAL
  Filled 2014-05-09: qty 56

## 2014-05-09 MED ORDER — OXYCODONE-ACETAMINOPHEN 5-325 MG PO TABS
2.0000 | ORAL_TABLET | ORAL | Status: DC | PRN
  Administered 2014-05-10 – 2014-05-11 (×2): 2 via ORAL
  Filled 2014-05-09 (×2): qty 2

## 2014-05-09 MED ORDER — CITRIC ACID-SODIUM CITRATE 334-500 MG/5ML PO SOLN: 30.0000 mL | ORAL | Status: DC | PRN

## 2014-05-09 MED ORDER — SIMETHICONE 80 MG PO CHEW: 80.0000 mg | CHEWABLE_TABLET | ORAL | Status: DC | PRN

## 2014-05-09 MED ORDER — OXYCODONE-ACETAMINOPHEN 5-325 MG PO TABS: 1.0000 | ORAL_TABLET | ORAL | Status: DC | PRN

## 2014-05-09 MED ORDER — DIBUCAINE 1 % RE OINT: 1.0000 "application " | TOPICAL_OINTMENT | RECTAL | Status: DC | PRN

## 2014-05-09 MED ORDER — FENTANYL 2.5 MCG/ML BUPIVACAINE 1/10 % EPIDURAL INFUSION (WH - ANES)
14.0000 mL/h | INTRAMUSCULAR | Status: DC | PRN
  Administered 2014-05-09: 14 mL/h via EPIDURAL
  Filled 2014-05-09: qty 125

## 2014-05-09 NOTE — Progress Notes (Signed)
I assisted Dr Nelida GoresMorgan Mc Eachern and Polo RileyJane Rn and Suzette BattiestVeronica RN with some questions and review and sign a consent and explanation for the c-section and vaginal delivery by Orlan LeavensViria Alvarez Spanish Interpreter

## 2014-05-09 NOTE — Progress Notes (Signed)
I assisted Sarah,RN with explanation of care plan. Eda H Royal  Interpreter.

## 2014-05-09 NOTE — Progress Notes (Signed)
I assisted Dr. Leata MouseMc Eachren with explanation of care plan. Eda H Royal Interpreter.

## 2014-05-09 NOTE — Anesthesia Preprocedure Evaluation (Signed)

## 2014-05-09 NOTE — Anesthesia Procedure Notes (Signed)
Epidural Patient location during procedure: OB Start time: 05/09/2014 12:11 PM  Staffing Anesthesiologist: Brayton CavesJACKSON, Dacari Beckstrand Performed by: anesthesiologist   Preanesthetic Checklist Completed: patient identified, site marked, surgical consent, pre-op evaluation, timeout performed, IV checked, risks and benefits discussed and monitors and equipment checked  Epidural Patient position: sitting Prep: site prepped and draped and DuraPrep Patient monitoring: continuous pulse ox and blood pressure Approach: midline Location: L3-L4 Injection technique: LOR air  Needle:  Needle type: Tuohy  Needle gauge: 17 G Needle length: 9 cm and 9 Needle insertion depth: 5 cm cm Catheter type: closed end flexible Catheter size: 19 Gauge Catheter at skin depth: 10 cm Test dose: negative  Assessment Events: blood not aspirated, injection not painful, no injection resistance, negative IV test and no paresthesia  Additional Notes Patient identified.  Risk benefits discussed including failed block, incomplete pain control, headache, nerve damage, paralysis, blood pressure changes, nausea, vomiting, reactions to medication both toxic or allergic, and postpartum back pain.  Patient expressed understanding and wished to proceed.  All questions were answered.  Sterile technique used throughout procedure and epidural site dressed with sterile barrier dressing. No paresthesia or other complications noted.The patient did not experience any signs of intravascular injection such as tinnitus or metallic taste in mouth nor signs of intrathecal spread such as rapid motor block. Please see nursing notes for vital signs.

## 2014-05-09 NOTE — Progress Notes (Signed)
I was present during delivery with Dr Ane PaymentMcEachern and Suzette BattiestVeronica RN by Orlan LeavensViria Alvarez Spanish Interpreter

## 2014-05-09 NOTE — H&P (Signed)
LABOR ADMISSION HISTORY AND PHYSICAL  Denise Avery is a 19 y.o. female 732P0101 with IUP at 2136w1d by LMP presenting in early labor. She has been contracting regularly since yesterday. They are increasing in frequency and intensity. She reports +FMs, No LOF, no VB, no blurry vision, headaches or peripheral edema, and RUQ pain.  She plans on breast feeding. She is undecided on birth control, but considering Paraguard IUD.   Dating: By LMP --->  Estimated Date [facility]: [facility]08/14  [facility]  Hx of preterm delivery by C/S for oligo, no PTL hx. 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 Positive TB test     Past Surgical History: Past Surgical History  Procedure Laterality Date  . Cesarean section      emergent at 8 mos.   . Cesarean section      Obstetrical History: OB History    Gravida Para Term Preterm AB TAB SAB Ectopic Multiple Living   2 1 0 1 0 0 0 0 0 1       Social History: History   Social History  . Marital Status: Single    Spouse Name: N/A  . Number of Children: N/A  . Years of Education: N/A   Social History Main Topics  . Smoking status: Never Smoker   . Smokeless tobacco: Never Used  . Alcohol Use: No  . Drug Use: No  . Sexual Activity: Yes   Other Topics Concern  . None   Social History Narrative    Family History: Family History  Problem  Relation Age of Onset  . Diabetes Maternal Grandmother     Allergies: No Known Allergies  Prescriptions prior to admission  Medication Sig Dispense Refill Last Dose  . acetaminophen (TYLENOL) 325 MG tablet Take 650 mg by mouth every 8 (eight) hours as needed for headache.    Past Month at Unknown time  . Prenatal Vit-Fe Fumarate-FA (PRENATAL VITAMINS PLUS) 27-1 MG TABS Take 1 tablet by mouth daily. 30 tablet 11 Past Week at Unknown time     Review of Systems   All systems reviewed and negative except as stated in HPI  Blood pressure 120/75, pulse 86, temperature 97.9 F (36.6 C), temperature source Oral, resp. rate 18, height 5\' 1"  (1.549 m), weight 156 lb (70.761 kg), last menstrual period 08/01/2013, SpO2 100 %, unknown if currently breastfeeding. General appearance: alert, cooperative and no distress Lungs: normal effort Heart: normal rate Abdomen: soft, non-tender; bowel sounds normal Extremities: Homans sign is negative, no sign of DVT Presentation: cephalic Fetal monitoringBaseline: 125 bpm, Variability: Good {> 6 bpm), Accelerations: Reactive and Decelerations: Absent Uterine activity every 5 minutes  Dilation: 3 Effacement (%): 80 Station: -1 Exam by:: SBeck,  RN   Prenatal labs: ABO, Rh: O/POS/-- (11/11 1139) Antibody: NEG (11/11 1139) Rubella:   RPR: NON REAC (11/11 1139)  HBsAg: NEGATIVE (11/11 1139)  HIV: NONREACTIVE (11/11 1139)  GBS: Negative (01/20 0000)  1 hr Glucola 93 Genetic screening  Too late Anatomy US Normal female   Results for orders placed or performed during the hospital encounter of 05/09/14 (from the past 24 hour(s))  CBC   Collection Time: 05/09/14 10:54 AM  Result Value Ref Range   WBC 15.3 (H) 4.0 - 10.5 K/uL   RBC 3.99 3.87 - 5.11 MIL/uL   Hemoglobin 12.0 12.0 - 15.0 g/dL   HCT 16.1 (L) 09.6 - 04.5 %   MCV 88.7 78.0 - 100.0 fL   MCH 30.1 26.0 - 34.0 pg   MCHC 33.9 30.0 - 36.0 g/dL   RDW 40.9 81.1 - 91.4 %   Platelets 235 150 -  400 K/uL    Patient Active Problem List   Diagnosis Date Noted  . Active labor 05/09/2014  . Exposure to TB 02/10/2014  . Encounter for routine screening for malformation using ultrasonics   . [redacted] weeks gestation of pregnancy   . History of cesarean delivery affecting pregnancy 02/02/2014  . Teen pregnancy 02/02/2014    Assessment: Denise Avery is a 19 y.o. G2P0101 at [redacted]w[redacted]d here for early labor.  #Labor: Expectant management. Plan to start pitocin if contractions space or if no cervical change.  #TOLAC: Consent signed and on chart.  #Pain: Epidural  #FWB: Category 1 #ID:  GBS neg #MOF: Breast #MOC:Undecided, considering Paraguard IUD  William Dalton 05/09/2014, 1:00 PM

## 2014-05-09 NOTE — Progress Notes (Signed)
I assisted Sarah, RN with questions.  Eda H Royal Interpreter. °

## 2014-05-09 NOTE — Progress Notes (Signed)
I was present during the Epidural with Dr. Freeman Jackson. Denise Avery  Interpreter. °

## 2014-05-09 NOTE — Progress Notes (Signed)
LABOR PROGRESS NOTE  Denise Avery is a 19 y.o. G2P0101 at 262w1d  admitted for active labor  Subjective: Increasing pain with contractions. Has epidural in place.   Objective: BP 114/79 mmHg  Pulse 95  Temp(Src) 97.9 F (36.6 C) (Oral)  Resp 18  Ht 5\' 1"  (1.549 m)  Wt 156 lb (70.761 kg)  BMI 29.49 kg/m2  SpO2 100%  LMP 08/01/2013 (Exact Date) or  Filed Vitals:   05/09/14 1600 05/09/14 1630 05/09/14 1700 05/09/14 1724  BP: 126/73 124/85 114/79   Pulse: 84 95 95   Temp:    97.9 F (36.6 C)  TempSrc:    Oral  Resp: 20 18 18    Height:      Weight:      SpO2:        Total I/O In: -  Out: 200 [Urine:200]  FHT:  FHR: 125 bpm, variability: moderate,  accelerations:  Present,  decelerations:  Absent UC:   regular, every 2 minutes SVE:   Dilation: 7 Effacement (%): 90 Station: 0 Exam by:: DR. Ane PaymentMcEachern  Dilation: 7 Effacement (%): 90 Cervical Position: Anterior Station: 0 Presentation: Vertex Exam by:: DR. Ane PaymentMcEachern   Labs: Lab Results  Component Value Date   WBC 15.3* 05/09/2014   HGB 12.0 05/09/2014   HCT 35.4* 05/09/2014   MCV 88.7 05/09/2014   PLT 235 05/09/2014    Assessment / Plan: Augmentation of labor, progressing well  Labor: Progressing well on pitocin. AROM, scant fluid.   Tolac: Consent reviewed with interpreter on placed on chart.  Fetal Wellbeing:  Category I Pain Control:  Epidural Anticipated MOD:  NSVD  William DaltonMcEachern, Wadell Craddock, MD 05/09/2014, 5:35 PM

## 2014-05-09 NOTE — MAU Note (Signed)
Per Eda, pt started contracting yesterday. Now q5 minutes apart. No lof. Was here yesterday.

## 2014-05-10 ENCOUNTER — Encounter (HOSPITAL_COMMUNITY): Payer: Self-pay | Admitting: *Deleted

## 2014-05-10 ENCOUNTER — Encounter: Payer: Self-pay | Admitting: Obstetrics and Gynecology

## 2014-05-10 LAB — CBC
HEMATOCRIT: 32 % — AB (ref 36.0–46.0)
HEMOGLOBIN: 11.1 g/dL — AB (ref 12.0–15.0)
MCH: 30.7 pg (ref 26.0–34.0)
MCHC: 34.7 g/dL (ref 30.0–36.0)
MCV: 88.6 fL (ref 78.0–100.0)
Platelets: 193 10*3/uL (ref 150–400)
RBC: 3.61 MIL/uL — ABNORMAL LOW (ref 3.87–5.11)
RDW: 15 % (ref 11.5–15.5)
WBC: 14.9 10*3/uL — AB (ref 4.0–10.5)

## 2014-05-10 LAB — HIV ANTIBODY (ROUTINE TESTING W REFLEX): HIV Screen 4th Generation wRfx: NONREACTIVE

## 2014-05-10 LAB — RPR: RPR Ser Ql: NONREACTIVE

## 2014-05-10 NOTE — Lactation Note (Signed)
This note was copied from the chart of Denise Avery. Lactation Consultation Note  P2, Ex BF 2 years. Hand expressed drops of colostrum. Reviewed supply and demand and suggest mother bf first before giving formula. Encouraged mother to bf often.  Mom encouraged to feed baby 8-12 times/24 hours and with feeding cues.  Mom made aware of O/P services, breastfeeding support groups, community resources, and our phone # for post-discharge questions in Spanish.      Patient Name: Denise Avery Today's Date: 05/10/2014 Reason for consult: Initial assessment;Follow-up assessment   Maternal Data Has patient been taught Hand Expression?: Yes Does the patient have breastfeeding experience prior to this delivery?: Yes  Feeding Feeding Type: Breast Fed Length of feed: 15 min  LATCH Score/Interventions                      Lactation Tools Discussed/Used     Consult Status Consult Status: Follow-up Date: 05/11/14 Follow-up type: In-patient    Dahlia ByesBerkelhammer, Olivia Pavelko [facility] 05/10/2014, 1:36 PM

## 2014-05-10 NOTE — Anesthesia Postprocedure Evaluation (Signed)
  Anesthesia Post-op Note  Patient: Denise Avery  Procedure(s) Performed: * No procedures listed *  Patient Location: Mother/Baby  Anesthesia Type:Epidural  Level of Consciousness: awake, alert , oriented and patient cooperative  Airway and Oxygen Therapy: Patient Spontanous Breathing  Post-op Pain: mild  Post-op Assessment: Patient's Cardiovascular Status Stable, Respiratory Function Stable, No headache, No backache, No residual numbness and No residual motor weakness  Post-op Vital Signs: stable  Last Vitals:  Filed Vitals:   05/10/14 0358  BP: 114/69  Pulse: 92  Temp: 37.2 C  Resp: 18    Complications: No apparent anesthesia complications

## 2014-05-10 NOTE — Progress Notes (Signed)
I stopped by patient's room to check on her needs, I ordered her dinner, snack and breakfast, by Viria Alvarez Spanish Interpreter °

## 2014-05-10 NOTE — Progress Notes (Signed)
I assisted betsy, RN with questions.  Eda H Royal  Interpreter.

## 2014-05-10 NOTE — Progress Notes (Signed)
I ordered patient's lunch. Denise Avery

## 2014-05-10 NOTE — Progress Notes (Signed)
Post Partum Day 1 Subjective: Denise Avery is a 19 year old G2P1102 PPD#1 after VBAC. She is up and moving around with mild pain that is well controlled with motrin. She reports vaginal bleeding is descreasing since delivery. She has urinated and had a bowel movement. She is tolerating PO. She had a girl, is breast feeding and wants an IUD for contraception.  Objective: Blood pressure 114/69, pulse 92, temperature 98.9 F (37.2 C), temperature source Oral, resp. rate 18, height 5\' 1"  (1.549 m), weight 70.761 kg (156 lb), last menstrual period 08/01/2013, SpO2 99 %, unknown if currently breastfeeding.  Physical Exam:  General: alert and no distress Lochia: appropriate Uterine Fundus: firm Incision: N/A DVT Evaluation: No evidence of DVT seen on physical exam. No cords or calf tenderness. No significant calf/ankle edema.   Recent Labs  05/09/14 1054 05/10/14 0540  HGB 12.0 11.1*  HCT 35.4* 32.0*    Assessment/Plan: Plan for discharge tomorrow   LOS: 1 day   Barrett,Stevi M 05/10/2014, 7:42 AM   OB fellow attestation Post Partum Day 1 I have seen and examined this patient and agree with above documentation in the student's note.   Denise Avery is a 19 y.o. [id number] s/p NSVD.  Pt denies problems with ambulating, voiding or po intake. Pain is well controlled.  Plan for birth control is Paraguard.  Method of Feeding: breast  PE:  BP 114/69 mmHg  Pulse 92  Temp(Src) 98.9 F (37.2 C) (Oral)  Resp 18  Ht 5\' 1"  (1.549 m)  Wt 156 lb (70.761 kg)  BMI 29.49 kg/m2  SpO2 99%  LMP 08/01/2013 (Exact Date)  Breastfeeding? Unknown Fundus firm  Plan for discharge: PPD#2  William DaltonMcEachern, Winslow Ederer, MD 8:23 AM

## 2014-05-10 NOTE — Progress Notes (Signed)
I assisted PA student with questions. Eda H Royal  Interpreter.

## 2014-05-10 NOTE — Progress Notes (Signed)
I assisted Susa RaringLisa Rn with some questions by Orlan LeavensViria Alvarez Spanish Interpreter

## 2014-05-11 ENCOUNTER — Encounter (HOSPITAL_COMMUNITY): Payer: Self-pay | Admitting: *Deleted

## 2014-05-11 MED ORDER — ETONOGESTREL 68 MG ~~LOC~~ IMPL
68.0000 mg | DRUG_IMPLANT | Freq: Once | SUBCUTANEOUS | Status: AC
  Administered 2014-05-11: 68 mg via SUBCUTANEOUS
  Filled 2014-05-11: qty 1

## 2014-05-11 MED ORDER — LIDOCAINE HCL 1 % IJ SOLN
0.0000 mL | Freq: Once | INTRAMUSCULAR | Status: AC | PRN
  Administered 2014-05-11: 2 mL via INTRADERMAL
  Filled 2014-05-11: qty 20

## 2014-05-11 NOTE — Lactation Note (Signed)
This note was copied from the chart of Denise Avery. Lactation Consultation Note  Patient Name: Denise Avery Today's Date: 05/11/2014   Mother states baby is breastfeeding well. She denies any concerns with feedings. She breastfed her other child for 2 years. Mother is breastfeeding and formula per choice. Has been informed and given Spanish brochure of outpatient lactation support services. Anticipating d/c to home today.  Maternal Data    Feeding Feeding Type: Breast Milk Nipple Type: Slow - flow  LATCH Score/Interventions                      Lactation Tools Discussed/Used     Consult Status      Omar PersonDaly, Saydee Zolman M 05/11/2014, 12:48 PM

## 2014-05-11 NOTE — Procedures (Signed)
Nexplanon Insertion: Spanish Interpreter used throughout procedure Discussed risk and benefits of procedure. Consent obtained. Lidocaine used to numb insertion site Betadine used to sterilize areas. Sterile field established. Nexplanon inserted with no complication. Pressure applied to area. Bandage placed.  Dr. Garry Heateraleigh Rumley, DO PGY-1 Evaluation and management procedures were performed by Resident physician under my supervision/collaboration. Chart reviewed, patient examined by me and I agree with management and plan. Danae Orleanseirdre C Cyrena Kuchenbecker, CNM 05/11/2014 3:33 PM

## 2014-05-11 NOTE — Discharge Instructions (Signed)
Etonogestrel implant Qu es este medicamento? El ETONOGESTREL es un dispositivo anticonceptivo (control de la natalidad). Se utiliza para prevenir el embarazo. Se puede utilizar hasta 3 aos. Este medicamento puede ser utilizado para otros usos; si tiene alguna pregunta consulte con su proveedor de atencin mdica o con su farmacutico. MARCAS COMERCIALES DISPONIBLES: Implanon, Nexplanon Qu le debo informar a mi profesional de la salud antes de tomar este medicamento? Necesita saber si usted presenta alguno de los siguientes problemas o situaciones: -sangrado vaginal anormal -enfermedad vascular o cogulos sanguneos -cncer de mama, cervical, heptico -depresin -diabetes -enfermedad de la vescula biliar -dolores de cabeza -enfermedad cardiaca o ataque cardiaco reciente -alta presin sangunea -alto nivel de colesterol -enfermedad renal -enfermedad heptica -convulsiones -fuma tabaco -una reaccin alrgica o inusual al etonogestrel, otras hormonas, anestsicos o antispticos, medicamentos, alimentos, colorantes o conservantes -si est embarazada o buscando quedar embarazada -si est amamantando a un beb Cmo debo utilizar este medicamento? Este dispositivo se inserta debajo de la piel en la cara interna de la parte superior del brazo por un profesional de la salud. Hable con su pediatra para informarse acerca del uso de este medicamento en nios. Puede requerir atencin especial. Sobredosis: Pngase en contacto inmediatamente con un centro toxicolgico o una sala de urgencia si usted cree que haya tomado demasiado medicamento. ATENCIN: Este medicamento es solo para usted. No comparta este medicamento con nadie. Qu sucede si me olvido de una dosis? No se aplica en este caso. Qu puede interactuar con este medicamento? No tome esta medicina con ninguno de los siguientes medicamentos: -amprenavir -bosentano -fosamprenavir Esta medicina tambin puede interactuar con los  siguientes medicamentos: -medicamentos barbitricos para inducir el sueo o tratar convulsiones -ciertos medicamentos para las infecciones micticas tales como quetoconazol e itraconazol -griseofulvina -medicamentos para tratar convulsiones, tales como carbamazepina, felbamato, oxcarbazepina, fenitona, topiramato -modafinil -fenilbutazona -rifampicina -algunos medicamentos para tratar la infeccin por VIH tales como atazanavir, indinavir, lopinavir, nelfinavir, tipranavir, ritonavir -hierba de [place] Puede ser que esta lista no menciona todas las posibles interacciones. Informe a su profesional de la salud de todos los productos a base de hierbas, medicamentos de venta libre o suplementos nutritivos que est tomando. Si usted fuma, consume bebidas alcohlicas o si utiliza drogas ilegales, indqueselo tambin a su profesional de la salud. Algunas sustancias pueden interactuar con su medicamento. A qu debo estar atento al usar este medicamento? Este medicamento no protege contra la infeccin por el VIH (SIDA) o otras enfermedades de transmisin sexual. Usted debe sentir el implante al presionar con las yemas de los dedos sobre la piel donde se insert. Dgale a su mdico si no se siente el implante. Qu efectos secundarios puedo tener al utilizar este medicamento? Efectos secundarios que debe informar a su mdico o a su profesional de la salud tan pronto como sea posible: -reacciones alrgicas como erupcin cutnea, picazn o urticarias, hinchazn de la cara, labios o lengua -ndulos mamarios -cambios en la visin -confusin, dificultad para hablar o entender -orina de color oscura -humor deprimido -sensacin general de estar enfermo o sntomas gripales -heces claras -prdida del apetito, nuseas -dolor en la regin abdominal superior derecha -dolores de cabeza severos -dolor, hinchazon o sensibilidad grave en el abdomen -falta de aliento, dolor en el pecho, hinchazn de la  pierna -seales de un embarazo -entumecimiento o debilidad repentina de la cara, brazo o pierna -dificultad para caminar, mareos, prdida de equilibrio o coordinacin -sangrado o flujo vaginal inusual -cansancio o debilidad inusual -color amarillento de los ojos o la piel   Efectos secundarios que, por lo general, no requieren atencin mdica (debe informarlos a su mdico o a su profesional de la salud si persisten o si son molestos): -acn -dolor de pecho -cambios de peso -tos -fiebre o escalofros -dolor de cabeza -sangrado menstrual irregular -picazn, ardo o flujo vaginal -dolor o dificultad para orinar -dolor de garganta Puede ser que esta lista no menciona todos los posibles efectos secundarios. Comunquese a su mdico por asesoramiento mdico sobre los efectos secundarios. Usted puede informar los efectos secundarios a la FDA por telfono al 1-800-FDA-1088. Dnde debo guardar mi medicina? Este medicamento se administra en hospitales o clnicas y no necesitar guardarlo en su domicilio. ATENCIN: Este folleto es un resumen. Puede ser que no cubra toda la posible informacin. Si usted tiene preguntas acerca de esta medicina, consulte con su mdico, su farmacutico o su profesional de la salud.  2015, Elsevier/Gold Standard. (2011-10-17 18:26:08)  

## 2014-05-11 NOTE — Progress Notes (Signed)
I assisted Dr. Katrinka BlazingSmith with explanation of care plan. Eda H Royal  Interpreter.

## 2014-05-11 NOTE — Progress Notes (Signed)
I assisted  LawyerHeather RN , Stormy RN, Dr Christy GentlesPoe with some information and discharged instructions by Orlan LeavensViria Alvarez Spanish Interpreter

## 2014-05-11 NOTE — Discharge Summary (Signed)
Obstetric Discharge Summary Reason for Admission: onset of labor Prenatal Procedures: none Intrapartum Procedures: spontaneous vaginal delivery Postpartum Procedures: none Complications-Operative and Postpartum: none  Delivery Note At 7:28 PM a viable female was delivered via VBAC, Spontaneous (Presentation: Right Occiput Anterior).  APGAR: 9, ; weight 7 lb 3.9 oz (3286 g).   Placenta status: Intact, Spontaneous.  Cord: 3 vessels with the following complications: None.  Cord pH: n/a  Anesthesia: Epidural  Episiotomy: None Lacerations: None Suture Repair: n/a Est. Blood Loss (mL): 450  Mom to postpartum.  Baby to Couplet care / Skin to Skin.  Denise Avery, Shelia Avery Avery 05/11/2014, 8:09 AM     Hospital Course:  Active Problems:   Active labor   Denise Avery is a 19 y.o. [id number] s/p VBAC.  She has postpartum course that was uncomplicated including no problems with ambulating, PO intake, urination, pain, or bleeding. The pt feels ready to go home and  will be discharged with outpatient follow-up.   Today: No acute events overnight.  Pt denies problems with ambulating, voiding or po intake.  She denies nausea or vomiting.  Pain is well controlled.  She has had flatus. She has not had bowel movement.  Lochia Minimal.  Plan for birth control is  nexplanon prior to discharge..  Method of Feeding: breast    H/H: Lab Results  Component Value Date/Time   HGB 11.1* 05/10/2014 05:40 AM   HCT 32.0* 05/10/2014 05:40 AM    Discharge Diagnoses: Term Pregnancy-delivered  Discharge Information: Date: 05/11/2014 Activity: pelvic rest Diet: routine  Medications: None Breast feeding:  Yes Condition: stable Instructions: refer to handout Discharge to: home   Discharge Instructions    Call MD for:  redness, tenderness, or signs of infection (pain, swelling, redness, odor or green/yellow discharge around incision site)    Complete by:  As directed      Call MD for:  severe  uncontrolled pain    Complete by:  As directed      Call MD for:  temperature >100.4    Complete by:  As directed      Diet - low sodium heart healthy    Complete by:  As directed             Medication List    TAKE these medications        acetaminophen 325 MG tablet  Commonly known as:  TYLENOL  Take 650 mg by mouth every 8 (eight) hours as needed for headache.     PRENATAL VITAMINS PLUS 27-1 MG Tabs  Take 1 tablet by mouth daily.           Follow-up Information    [facility] In [facility].   Specialty:  Obstetrics and Gynecology   Contact information:   [address] [place] [phone]      Denise Avery, Denise Avery ,MD PGY-2 [facility]   05/11/2014,8:09 AM

## 2014-05-11 NOTE — Progress Notes (Signed)
UR chart review completed.  

## 2014-05-11 NOTE — Progress Notes (Signed)
I stopped by to check on patient's needs and ordered her lunch.  Denise Avery Interpreter. °

## 2014-05-19 ENCOUNTER — Encounter: Payer: Self-pay | Admitting: Advanced Practice Midwife

## 2014-05-24 ENCOUNTER — Encounter: Payer: Self-pay | Admitting: General Practice

## 2014-06-22 ENCOUNTER — Ambulatory Visit: Payer: Self-pay | Admitting: Obstetrics & Gynecology

## 2015-06-02 ENCOUNTER — Encounter (HOSPITAL_COMMUNITY): Payer: Self-pay | Admitting: Vascular Surgery

## 2015-06-02 DIAGNOSIS — R51 Headache: Secondary | ICD-10-CM | POA: Insufficient documentation

## 2015-06-02 DIAGNOSIS — N61 Mastitis without abscess: Secondary | ICD-10-CM | POA: Insufficient documentation

## 2015-06-02 DIAGNOSIS — M542 Cervicalgia: Secondary | ICD-10-CM | POA: Insufficient documentation

## 2015-06-02 LAB — CBC
HEMATOCRIT: 38.4 % (ref 36.0–46.0)
HEMOGLOBIN: 12.9 g/dL (ref 12.0–15.0)
MCH: 28.9 pg (ref 26.0–34.0)
MCHC: 33.6 g/dL (ref 30.0–36.0)
MCV: 86.1 fL (ref 78.0–100.0)
Platelets: 240 10*3/uL (ref 150–400)
RBC: 4.46 MIL/uL (ref 3.87–5.11)
RDW: 13.7 % (ref 11.5–15.5)
WBC: 10.2 10*3/uL (ref 4.0–10.5)

## 2015-06-02 LAB — BASIC METABOLIC PANEL
ANION GAP: 14 (ref 5–15)
BUN: 11 mg/dL (ref 6–20)
CO2: 21 mmol/L — AB (ref 22–32)
Calcium: 9 mg/dL (ref 8.9–10.3)
Chloride: 104 mmol/L (ref 101–111)
Creatinine, Ser: 0.59 mg/dL (ref 0.44–1.00)
GFR calc non Af Amer: >60 mL/min (ref >60)
Glucose, Bld: 109 mg/dL — ABNORMAL HIGH (ref 65–99)
Potassium: 3.4 mmol/L — ABNORMAL LOW (ref 3.5–5.1)
Sodium: 139 mmol/L (ref 135–145)

## 2015-06-02 LAB — I-STAT TROPONIN, ED: Troponin i, poc: 0 ng/mL (ref 0.00–0.08)

## 2015-06-02 NOTE — ED Notes (Signed)
Pt reports to the ED for eval of right breast pain. Pt reports the pain is also in her neck and right arm. Pt reports associated symptoms of SOB, lightheadedness, and dizziness. Pt denies any N/V. She is breast feeding. The right breast is swollen and red. Breastfeeding makes the pain worse. Pt A&Ox4, resp e/u, and skin warm and dry.

## 2015-06-03 ENCOUNTER — Emergency Department (HOSPITAL_COMMUNITY)
Admission: EM | Admit: 2015-06-03 | Discharge: 2015-06-03 | Disposition: A | Discharge status: Home / Self Care | Payer: Medicaid Other | Attending: Emergency Medicine | Admitting: Emergency Medicine

## 2015-06-03 DIAGNOSIS — N6011 Diffuse cystic mastopathy of right breast: Secondary | ICD-10-CM

## 2015-06-03 MED ORDER — CEPHALEXIN 250 MG PO CAPS
500.0000 mg | ORAL_CAPSULE | Freq: Once | ORAL | Status: AC
  Administered 2015-06-03: 500 mg via ORAL
  Filled 2015-06-03: qty 2

## 2015-06-03 MED ORDER — ACETAMINOPHEN 500 MG PO TABS
1000.0000 mg | ORAL_TABLET | Freq: Once | ORAL | Status: AC
  Administered 2015-06-03: 1000 mg via ORAL
  Filled 2015-06-03: qty 2

## 2015-06-03 MED ORDER — CEPHALEXIN 500 MG PO CAPS: 500.0000 mg | ORAL_CAPSULE | Freq: Four times a day (QID) | ORAL | Status: DC

## 2015-06-03 NOTE — ED Notes (Signed)
Set breast pump for pt at bedside, explained how to use.  Left pt's room when she was able to express breast milk on her own.

## 2015-06-03 NOTE — Discharge Instructions (Signed)
,  Please obtain all of your results from medical records or have your doctors office obtain the results - share them with your doctor - you should be seen at your doctors office in the next 2 days. Call today to arrange your follow up. Take the medications as prescribed. Please review all of the medicines and only take them if you do not have an allergy to them. Please be aware that if you are taking birth control pills, taking other prescriptions, ESPECIALLY ANTIBIOTICS may make the birth control ineffective - if this is the case, either do not engage in sexual activity or use alternative methods of birth control such as condoms until you have finished the medicine and your family doctor says it is OK to restart them. If you are on a blood thinner such as COUMADIN, be aware that any other medicine that you take may cause the coumadin to either work too much, or not enough - you should have your coumadin level rechecked in next 7 days if this is the case.  °?  °It is also a possibility that you have an allergic reaction to any of the medicines that you have been prescribed - Everybody reacts differently to medications and while MOST people have no trouble with most medicines, you may have a reaction such as nausea, vomiting, rash, swelling, shortness of breath. If this is the case, please stop taking the medicine immediately and contact your physician.  °?  °You should return to the ER if you develop severe or worsening symptoms.  ° ° °

## 2015-06-03 NOTE — ED Provider Notes (Signed)
CSN: [id number]     Arrival date & time 06/02/15  2056 History  By signing my name below, I, Marisue HumbleMichelle Chaffee, attest that this documentation has been prepared under the direction and in the presence of Eber HongBrian Jamari Diana, MD . Electronically Signed: Marisue HumbleMichelle Chaffee, Scribe. 06/03/2015. 2:59 AM.   Chief Complaint  Patient presents with  . Breast Pain   The history is provided by the patient. A language interpreter was used.   HPI Comments:  Denise Avery is a 20 y.o. female with no pertinent PMHx who presents to the Emergency Department complaining of constant, moderate-severe right breast pain onset two days ago; pain radiates to right arm and neck. Pt reports associated headache and erythema of right breast. Pain is worsened with breastfeeding. She states she has been using the right breast to feed today and usually feeds from both breasts. Pt notes her right breast is normally slightly larger than the left, but not to the current extent; she notes difference in size was the same yesterday. Pt denies drainage or discharge from right nipple, any current medications, or medication allergies.  Past Medical History  Diagnosis Date  . 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 Cesarean section      emergent at 8 mos.   . Cesarean section     Family History  Problem Relation Age of Onset  . Diabetes Maternal Grandmother    Social History  Substance Use Topics  . Smoking status: Never Smoker   . Smokeless tobacco: Never Used  . Alcohol Use: No   OB History    Gravida Para Term Preterm AB TAB SAB Ectopic Multiple Living   2 2 1 1  0 0 0 0 0 2     Review of Systems  Genitourinary:       Right breast pain and swelling  Musculoskeletal: Positive for arthralgias and neck pain.  Neurological: Positive for headaches.   Allergies  Review of patient's allergies indicates no known allergies.  Home Medications   Prior to Admission medications    Medication Sig Start Date End Date Taking? Authorizing Provider  cephALEXin (KEFLEX) 500 MG capsule Take 1 capsule (500 mg total) by mouth 4 (four) times daily. 06/03/15   Eber HongBrian Shamonique Battiste, MD  Prenatal Vit-Fe Fumarate-FA (PRENATAL VITAMINS PLUS) 27-1 MG TABS Take 1 tablet by mouth daily. Patient not taking: Reported on 06/03/2015 02/16/14   Scot JunKaren E Teague Clark, PA-C   BP 117/83 mmHg  Pulse 117  Temp(Src) 100.3 F (37.9 C) (Oral)  Resp 16  SpO2 100% Physical Exam  Constitutional: She appears well-developed and well-nourished. No distress.  HENT:  Head: Normocephalic and atraumatic.  Mouth/Throat: Oropharynx is clear and moist. No oropharyngeal exudate.  Eyes: Conjunctivae and EOM are normal. Pupils are equal, round, and reactive to light. Right eye exhibits no discharge. Left eye exhibits no discharge. No scleral icterus.  Neck: Normal range of motion. Neck supple. No JVD present. No thyromegaly present.  Cardiovascular: Normal rate, regular rhythm, normal heart sounds and intact distal pulses.  Exam reveals no gallop and no friction rub.   No murmur heard. Pulmonary/Chest: Effort normal and breath sounds normal. No respiratory distress. She has no wheezes. She has no rales.  Large engorged right breast, erythematous surface to the lateral and lateral superior aspect, milk freely flowing from the nipple. Chaperone present for exam  Abdominal: Soft. Bowel sounds are normal. She exhibits no distension and no  mass. There is no tenderness.  Musculoskeletal: Normal range of motion. She exhibits no edema or tenderness.  Lymphadenopathy:    She has no cervical adenopathy.  Neurological: She is alert. Coordination normal.  Skin: Skin is warm and dry. No rash noted. No erythema.  Psychiatric: She has a normal mood and affect. Her behavior is normal.  Nursing note and vitals reviewed.  ED Course  Procedures  DIAGNOSTIC STUDIES:  Oxygen Saturation is 100% on RA, normal by my interpretation.     COORDINATION OF CARE:  2:11 AM Informed pt that breast looks like it is draining normal amounts of milk, but the redness on the outside looks like infection. Will order antibiotic. Instructed pt to use cool packs on the breast to help reduce swelling. Discussed treatment plan with pt at bedside and pt agreed to plan.  Labs Review Labs Reviewed  BASIC METABOLIC PANEL - Abnormal; Notable for the following:    Potassium 3.4 (*)    CO2 21 (*)    Glucose, Bld 109 (*)    All other components within normal limits  CBC  I-STAT TROPOININ, ED    Imaging Review No results found. I have personally reviewed and evaluated these images and lab results as part of my medical decision-making.   EKG Interpretation   Date/Time:  Friday June 02 2015 21:19:10 EST Ventricular Rate:  139 PR Interval:  158 QRS Duration: 66 QT Interval:  270 QTC Calculation: 410 R Axis:   52 Text Interpretation:  Sinus tachycardia Otherwise normal ECG No old  tracing to compare Confirmed by Cintia Gleed  MD, Rocklin Soderquist ([id number]) on 06/03/2015  1:28:02 AM      MDM   Final diagnoses:  Mastitis chronic, right    I personally performed the services described in this documentation, which was scribed in my presence. The recorded information has been reviewed and is accurate.   The patient is significantly improved after being able to pump over 20 ounces of milk out of her breast, she does have a slight redness on the lateral superior aspect of the breast which would be consistent with a mastitis, she has been educated thoroughly on the use of the antibiotic and care going forward, referred to [facility] should she need more help with this. She expresses her understanding  Meds given in ED:  Medications  acetaminophen (TYLENOL) tablet 1,000 mg (1,000 mg Oral Given 06/03/15 0240)  cephALEXin (KEFLEX) capsule 500 mg (500 mg Oral Given 06/03/15 0241)    New Prescriptions   CEPHALEXIN (KEFLEX) 500 MG CAPSULE    Take 1  capsule (500 mg total) by mouth 4 (four) times daily.      Eber Hong, MD 06/03/15 [phone]

## 2015-06-03 NOTE — ED Notes (Signed)
Pt left with all his belongings and ambulated out of the treatment area.  

## 2015-06-03 NOTE — ED Notes (Signed)
Pt left with all her belongings and ambulated out of the treatment area.l

## 2015-06-03 NOTE — ED Notes (Signed)
Poured out 16oz breast milk so pt could keep pumping per pt's request.  Pt states she feels some relief.

## 2015-06-22 ENCOUNTER — Emergency Department (HOSPITAL_COMMUNITY)
Admission: EM | Admit: 2015-06-22 | Discharge: 2015-06-22 | Disposition: A | Discharge status: Home / Self Care | Payer: Medicaid Other | Attending: Emergency Medicine | Admitting: Emergency Medicine

## 2015-06-22 ENCOUNTER — Encounter (HOSPITAL_COMMUNITY): Payer: Self-pay | Admitting: *Deleted

## 2015-06-22 DIAGNOSIS — K0889 Other specified disorders of teeth and supporting structures: Secondary | ICD-10-CM | POA: Insufficient documentation

## 2015-06-22 DIAGNOSIS — Z8611 Personal history of tuberculosis: Secondary | ICD-10-CM | POA: Insufficient documentation

## 2015-06-22 DIAGNOSIS — K029 Dental caries, unspecified: Secondary | ICD-10-CM | POA: Insufficient documentation

## 2015-06-22 DIAGNOSIS — Z792 Long term (current) use of antibiotics: Secondary | ICD-10-CM | POA: Insufficient documentation

## 2015-06-22 DIAGNOSIS — K002 Abnormalities of size and form of teeth: Secondary | ICD-10-CM | POA: Insufficient documentation

## 2015-06-22 MED ORDER — AMOXICILLIN 500 MG PO CAPS: 500.0000 mg | ORAL_CAPSULE | Freq: Three times a day (TID) | ORAL | Status: DC

## 2015-06-22 NOTE — ED Notes (Signed)
Pt is here with 6 days of tooth pain to left upper tooth and states there is bumps around it.

## 2015-06-22 NOTE — ED Provider Notes (Signed)
History  By signing my name below, I, Karle Plumber, attest that this documentation has been prepared under the direction and in the presence of Rob Willow[place], [place]. Electronically Signed: Karle Plumber, ED Scribe. 06/22/2015. 3:50 PM.  Chief Complaint  Patient presents with  . Dental Pain   The history is provided by the patient and medical records. A language interpreter was used.    HPI Comments:  Denise Avery is a 20 y.o. female who presents to the Emergency Department complaining of left upper dental pain that began 6 days ago. She reports "bumps" around the tooth that is bothering her. She has taken Ibuprofen with some relief of the pain. She denies modifying factors. She denies fever, chills, nausea, vomiting. She does not have a dentist. She denies allergies to any medications. She denies pregnancy but states she is currently breast feeding.  Past Medical History  Diagnosis Date  . 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 Cesarean section      emergent at 8 mos.   . Cesarean section     Family History  Problem Relation Age of Onset  . Diabetes Maternal Grandmother    Social History  Substance Use Topics  . Smoking status: Never Smoker   . Smokeless tobacco: Never Used  . Alcohol Use: No   OB History    Gravida Para Term Preterm AB TAB SAB Ectopic Multiple Living   0 0 0 0 0 2     Review of Systems  HENT: Positive for dental problem.     Allergies  Review of patient's allergies indicates no known allergies.  Home Medications   Prior to Admission medications   Medication Sig Start Date End Date Taking? Authorizing Provider  cephALEXin (KEFLEX) 500 MG capsule Take 1 capsule (500 mg total) by mouth 4 (four) times daily. 06/03/15   Eber Hong, MD  Prenatal Vit-Fe Fumarate-FA (PRENATAL VITAMINS PLUS) 27-1 MG TABS Take 1 tablet by mouth daily. Patient not taking: Reported on 06/03/2015 02/16/14    Bertram Denver, PA-C   Triage Vitals: BP 112/76 mmHg  Pulse 79  Temp(Src) 97.6 F (36.4 C) (Oral)  Resp 16  SpO2 100% Physical Exam Physical Exam  Constitutional: Pt appears well-developed and well-nourished.  HENT:  Head: Normocephalic.  Right Ear: Tympanic membrane, external ear and ear canal normal.  Left Ear: Tympanic membrane, external ear and ear canal normal.  Nose: Nose normal. Right sinus exhibits no maxillary sinus tenderness and no frontal sinus tenderness. Left sinus exhibits no maxillary sinus tenderness and no frontal sinus tenderness.  Mouth/Throat: Uvula is midline, oropharynx is clear and moist and mucous membranes are normal. No oral lesions. Abnormal dentition. Dental caries present. No uvula swelling or lacerations. No oropharyngeal exudate, posterior oropharyngeal edema, posterior oropharyngeal erythema or tonsillar abscesses.  No gingival swelling, fluctuance or induration No gross abscess  Eyes: Conjunctivae are normal. Pupils are equal, round, and reactive to light. Right eye exhibits no discharge. Left eye exhibits no discharge.  Neck: Normal range of motion. Neck supple.  No stridor Handling secretions without difficulty No nuchal rigidity No cervical lymphadenopathy Cardiovascular: Normal rate, regular rhythm and normal heart sounds.   Pulmonary/Chest: Effort normal. No respiratory distress.  Equal chest rise  Abdominal: Soft. Bowel sounds are normal. Pt exhibits no distension. There is no tenderness.  Lymphadenopathy:    Pt has no cervical adenopathy.  Neurological: Pt is  alert.  Skin: Skin is warm and dry.  Psychiatric: Pt has a normal mood and affect.  Nursing note and vitals reviewed.   ED Course  Procedures (including critical care time) DIAGNOSTIC STUDIES: Oxygen Saturation is 100% on RA, normal by my interpretation.   COORDINATION OF CARE: 3:47 PM- Will prescribe antibiotics and give referral to dentist. Advised pt to continue to take  Ibuprofen and Tylenol for pain. Pt verbalizes understanding and agrees to plan.  Medications - No data to display   MDM   Final diagnoses:  Pain, dental    Patient with toothache.  No gross abscess.  Exam unconcerning for Ludwig's angina or spread of infection.  Will treat with amoxicillin and pain medicine.  Urged patient to follow-up with dentist.     I personally performed the services described in this documentation, which was scribed in my presence. The recorded information has been reviewed and is accurate.       Roxy Horsemanobert Sindhu Nguyen, PA-C 06/22/15 1557  Pricilla LovelessScott Goldston, MD 06/27/15 1131

## 2015-06-22 NOTE — Discharge Instructions (Signed)

## 2015-06-22 NOTE — ED Notes (Signed)
Declined W/C at D/C and was escorted to lobby by RN. 

## 2015-08-12 DIAGNOSIS — N6011 Diffuse cystic mastopathy of right breast: Secondary | ICD-10-CM | POA: Insufficient documentation

## 2015-08-13 ENCOUNTER — Encounter (HOSPITAL_COMMUNITY): Payer: Self-pay

## 2015-08-13 ENCOUNTER — Emergency Department (HOSPITAL_COMMUNITY)
Admission: EM | Admit: 2015-08-13 | Discharge: 2015-08-13 | Disposition: A | Discharge status: Home / Self Care | Payer: Medicaid Other | Attending: Emergency Medicine | Admitting: Emergency Medicine

## 2015-08-13 DIAGNOSIS — N6011 Diffuse cystic mastopathy of right breast: Secondary | ICD-10-CM

## 2015-08-13 HISTORY — DX: Mastitis without abscess: N61.0

## 2015-08-13 LAB — COMPREHENSIVE METABOLIC PANEL
ALK PHOS: 87 U/L (ref 38–126)
ALT: 17 U/L (ref 14–54)
AST: 15 U/L (ref 15–41)
Albumin: 4.1 g/dL (ref 3.5–5.0)
Anion gap: 7 (ref 5–15)
BILIRUBIN TOTAL: 0.3 mg/dL (ref 0.3–1.2)
BUN: 9 mg/dL (ref 6–20)
CO2: 23 mmol/L (ref 22–32)
Calcium: 9 mg/dL (ref 8.9–10.3)
Chloride: 108 mmol/L (ref 101–111)
Creatinine, Ser: 0.66 mg/dL (ref 0.44–1.00)
GFR calc Af Amer: >60 mL/min (ref >60)
Glucose, Bld: 105 mg/dL — ABNORMAL HIGH (ref 65–99)
POTASSIUM: 3.7 mmol/L (ref 3.5–5.1)
Sodium: 138 mmol/L (ref 135–145)
TOTAL PROTEIN: 7.4 g/dL (ref 6.5–8.1)

## 2015-08-13 LAB — CBC WITH DIFFERENTIAL/PLATELET
BASOS ABS: 0 10*3/uL (ref 0.0–0.1)
Basophils Relative: 0 %
EOS ABS: 0.3 10*3/uL (ref 0.0–0.7)
Eosinophils Relative: 2 %
HCT: 38 % (ref 36.0–46.0)
Hemoglobin: 12.4 g/dL (ref 12.0–15.0)
Lymphocytes Relative: 15 %
Lymphs Abs: 1.7 10*3/uL (ref 0.7–4.0)
MCH: 28.4 pg (ref 26.0–34.0)
MCHC: 32.6 g/dL (ref 30.0–36.0)
MCV: 87 fL (ref 78.0–100.0)
MONO ABS: 0.7 10*3/uL (ref 0.1–1.0)
Monocytes Relative: 6 %
Neutro Abs: 9 10*3/uL — ABNORMAL HIGH (ref 1.7–7.7)
Neutrophils Relative %: 77 %
PLATELETS: 279 10*3/uL (ref 150–400)
RBC: 4.37 MIL/uL (ref 3.87–5.11)
RDW: 13.8 % (ref 11.5–15.5)
WBC: 11.6 10*3/uL — AB (ref 4.0–10.5)

## 2015-08-13 LAB — I-STAT CG4 LACTIC ACID, ED: LACTIC ACID, VENOUS: 1.01 mmol/L (ref 0.5–2.0)

## 2015-08-13 MED ORDER — ACETAMINOPHEN 325 MG PO TABS
650.0000 mg | ORAL_TABLET | Freq: Once | ORAL | Status: AC | PRN
  Administered 2015-08-13: 650 mg via ORAL

## 2015-08-13 MED ORDER — ACETAMINOPHEN 500 MG PO TABS: 500.0000 mg | ORAL_TABLET | Freq: Four times a day (QID) | ORAL | Status: DC | PRN

## 2015-08-13 MED ORDER — SODIUM CHLORIDE 0.9 % IV BOLUS (SEPSIS)
1000.0000 mL | Freq: Once | INTRAVENOUS | Status: AC
  Administered 2015-08-13: 1000 mL via INTRAVENOUS

## 2015-08-13 MED ORDER — CEPHALEXIN 500 MG PO CAPS: 500.0000 mg | ORAL_CAPSULE | Freq: Four times a day (QID) | ORAL | Status: DC

## 2015-08-13 MED ORDER — ACETAMINOPHEN 325 MG PO TABS
ORAL_TABLET | ORAL | Status: AC
  Filled 2015-08-13: qty 2

## 2015-08-13 MED ORDER — CEPHALEXIN 250 MG PO CAPS
500.0000 mg | ORAL_CAPSULE | Freq: Once | ORAL | Status: AC
  Administered 2015-08-13: 500 mg via ORAL
  Filled 2015-08-13: qty 2

## 2015-08-13 NOTE — Discharge Instructions (Signed)
Mastitis   (Mastitis)  La mastitis es una inflamación en el tejido mamario. Generalmente ocurre en las mujeres que amamantan, pero también puede afectar a otras mujeres y, en algunos casos, a los hombres.  CAUSAS   Generalmente la causa de la mastitis es una infección bacteriana. Las bacterias ingresan al tejido mamario través de cortes o grietas en la piel. Generalmente esto ocurre al amamantar, debido a las grietas o irritación de la piel. En algunos casos puede ocurrir aún cuando no haya grietas en la piel. También se relaciona con la obstrucción de los conductos por los que sale la leche (lactíferos). Un piercing en los pezones puede ocasionar una mastitis. Además, algunas formas de cáncer de mama pueden causar mastitis.  SIGNOS Y SÍNTOMAS   · Hinchazón, enrojecimiento, sensibilidad y dolor en la zona de la mama.  · Hinchazón de los ganglios que se encuentran debajo del brazo, en el mismo lado.  · Fiebre.  Si se permite que la infección progrese, podrá formarse una acumulación de pus (absceso).  DIAGNÓSTICO   El médico podrá hacer el diagnóstico de mastitis en base a sus síntomas y el examen físico. Le indicarán estudios para confirmar el diagnóstico. Estos pueden ser:   · Extracción del pus de la mama, aplicando presión en la zona. El pus se examinará en el laboratorio para determinar de qué bacteria se trata. Si hay un absceso, podrán retirarle el líquido con una aguja. Con el líquido se confirmará el diagnóstico y se determinará la bacteria que causa el problema. En la mayoría de los casos no se observa pus.  · Le solicitarán análisis de sangre para determinar si su organismo está luchando contra una infección bacteriana.  · Una mamografía o una ecografía podrán descartar otros problemas o enfermedades.  TRATAMIENTO   Antibióticos para combatir la infección bacteriana. El profesional determinará qué bacteria es la que está causando la infección y seleccionará el tipo de antibiótico más adecuado. Podrá  cambiarlo según el resultado del cultivo o si la respuesta al antibiótico no es la adecuada. Los antibióticos se administran por vía oral. También le recetará medicamentos para el dolor.  La mastitis que se produce debido al amamantamiento podrá mejorar sin tratamiento, por lo tanto el médico podrá indicarle que espere 24 horas después de verla por primera vez para decidir si necesita recetarle un medicamento.  INSTRUCCIONES PARA EL CUIDADO EN EL HOGAR   · Sólo tome medicamentos de venta libre o recetados para calmar el dolor, el malestar o bajar la fiebre, según las indicaciones de su médico.  · Si su médico le receta antibióticos, tómelos tal como le indicó. Asegúrese de que finaliza la prescripción completa aunque se sienta mejor.  · No use un sostén demasiado ajustado o con aro. Use un sostén blando, de soporte.  · Aumente la ingestión de líquidos, especialmente si tiene fiebre.  · Las mujeres que amamantan deben seguir las siguientes indicaciones:    Amamante hasta vaciar la mama. El profesional le informará si su leche es segura para el bebé o debe descartarla. Podrán indicarle que deje de amamantar hasta que el médico considere que es seguro para su bebé. Use un sacaleche si le aconsejan dejar de amamantar.    Mantenga los pezones secos y limpios.    Vacíe la primera mama completamente antes de amamantar con la segunda. Si el bebé no vacía la mama por algún motivo, utilice un sacaleche.    Si debe regresar a su empleo, use un sacaleche en el   a pus por la mama.  Los sntomas no mejoran con el tratamiento indicado por su mdico dentro de los [address]  [address] SOLICITE ATENCIN MDICA DE INMEDIATO SI:  El dolor y la hinchazn empeoran.  Aumenta el dolor y no puede controlarlo con Tourist information centre managerla medicacin.  Observa una lnea roja que se extiende desde la mama hasta la axila.  Tiene  fiebre o sntomas persistentes durante ms de 2 - 3 das.  Tiene fiebre y los sntomas empeoran repentinamente. Esta informacin no tiene Theme park managercomo fin reemplazar el consejo del mdico. Asegrese de hacerle al mdico cualquier pregunta que tenga.  Document Released: 12/19/2004 Document Revised: 03/16/2013  Elsevier Interactive Patient Education [employer]2016 Elsevier Inc.

## 2015-08-13 NOTE — ED Notes (Signed)
Per interpreter line, pt from home with complaint of right sided breast pain, and generalized bodyaches associated with chills. Pt is breast feeding and states that her right breast is "sore, tender to the touch and larger than normal" Pt states that she had a similar episode 2 months ago and was treated with antibiotics and was told that if it came back to come back in.

## 2015-08-13 NOTE — ED Notes (Signed)
Patient not in room yet 

## 2015-08-13 NOTE — ED Provider Notes (Signed)
CSN: [id number]     Arrival date & time 08/12/15  2337 History   First MD Initiated Contact with Patient 08/13/15 0602     Chief Complaint  Patient presents with  . Fever     (Consider location/radiation/quality/duration/timing/severity/associated sxs/prior Treatment) Patient is a 20 y.o. female presenting with fever. The history is provided by the patient. The history is limited by a language barrier. A language interpreter was used ([place] ).  Fever  Denise Avery is a [age] y.o. female with no significant PMH who presents with gradual onset, constant, worsening right breast pain that radiates to the right arm x 1 day.  Associated right upper back pain, erythema, erythema and swelling of the right breast.  Pain is worse with breast feeding.  Last feed yesterday 8 PM. Denies nipple discharge.  She reports she was seen a couple of months ago for mastitis and given an antibiotic that helped.  She states this presentation feels the same way.   Patient seen here 06/03/15 for mastitis and treated with Tylenol and Keflex and [facility]  She has not followed up with [facility].   Past Medical History  Diagnosis Date  . 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 Mastitis    Past Surgical History  Procedure Laterality Date  . Cesarean section      emergent at 8 mos.   . Cesarean section     Family History  Problem Relation Age of Onset  . Diabetes Maternal Grandmother    Social History  Substance Use Topics  . Smoking status: Never Smoker   . Smokeless tobacco: Never Used  . Alcohol Use: No   OB History    Gravida Para Term Preterm AB TAB SAB Ectopic Multiple Living   2 2 1 1  0 0 0 0 0 2     Review of Systems  Constitutional: Positive for fever.   All other systems negative unless otherwise stated in HPI    Allergies  Review of patient's allergies indicates no known allergies.  Home Medications   Prior to Admission medications   Medication Sig Start  Date End Date Taking? Authorizing Provider  amoxicillin (AMOXIL) 500 MG capsule Take 1 capsule (500 mg total) by mouth 3 (three) times daily. Patient not taking: Reported on 08/13/2015 06/22/15   Roxy Horsemanobert Browning, PA-C  cephALEXin (KEFLEX) 500 MG capsule Take 1 capsule (500 mg total) by mouth 4 (four) times daily. Patient not taking: Reported on 08/13/2015 06/03/15   Eber HongBrian Miller, MD  Prenatal Vit-Fe Fumarate-FA (PRENATAL VITAMINS PLUS) 27-1 MG TABS Take 1 tablet by mouth daily. Patient not taking: Reported on 06/03/2015 02/16/14   Scot JunKaren E Teague Clark, PA-C   BP 105/67 mmHg  Pulse 89  Temp(Src) 98.1 F (36.7 C) (Oral)  Resp 16  SpO2 100%  Breastfeeding? Yes Physical Exam  Constitutional: She is oriented to person, place, and time. She appears well-developed and well-nourished.  Non-toxic appearance. She does not have a sickly appearance. She does not appear ill.  HENT:  Head: Normocephalic and atraumatic.  Mouth/Throat: Oropharynx is clear and moist.  Eyes: Conjunctivae are normal.  Neck: Normal range of motion. Neck supple.  Cardiovascular: Normal rate and regular rhythm.   Pulmonary/Chest: Effort normal and breath sounds normal. No accessory muscle usage or stridor. No respiratory distress. She has no wheezes. She has no rhonchi. She has no rales.  Abdominal: Soft. Bowel sounds are normal. She exhibits no distension.  Genitourinary: There  is breast swelling and tenderness. No breast discharge or bleeding.  Chaperone breast. Right breast engorged with exquisite tenderness notably on the right lateral and superior aspect.  Erythema to right lateral and superior aspect of right breast.  No active discharge.  No evidence of abscess. No peau d'orange appearance.  Left breast nontender and of normal size (according to patient). No erythema, warmth, or swelling.   Musculoskeletal: Normal range of motion.  Lymphadenopathy:    She has no cervical adenopathy.  Neurological: She is alert and  oriented to person, place, and time.  Speech clear without dysarthria.  Skin: Skin is warm and dry.  Psychiatric: She has a normal mood and affect. Her behavior is normal.    ED Course  Procedures (including critical care time) Labs Review Labs Reviewed  COMPREHENSIVE METABOLIC PANEL - Abnormal; Notable for the following:    Glucose, Bld 105 (*)    All other components within normal limits  CBC WITH DIFFERENTIAL/PLATELET - Abnormal; Notable for the following:    WBC 11.6 (*)    Neutro Abs 9.0 (*)    All other components within normal limits  I-STAT CG4 LACTIC ACID, ED    Imaging Review No results found. I have personally reviewed and evaluated these images and lab results as part of my medical decision-making.   EKG Interpretation None      MDM   Final diagnoses:  Mastitis chronic, right   Breast feeding patient presents with right breast pain x 1 day.  Seen for mastitis in the past, and states this is the same presentation.  No nipple discharge.  On arrival, patient febrile, temp 101.2, and tachycardic HR 129.  Normotensive.  She was given Tylenol and fever resolved, temp 98.1.  Tachycardia slightly improved, HR 106.  Will give IVF.  On exam, patient appears well, non-toxic, or septic appearing.  Heart RRR, lungs CTAB, right breast engorged with erythema and exquisite tenderness on lateral and superior aspect.  No evidence of abscess.  Labs show no evidence of sepsis.  Mild leukocytosis, 11.6.  Normal lactic.  Findings consistent with mastitis.  Patient will be given dose of Keflex here as well as prescription at discharge.  Follow up [facility].  Discussed return precautions.  Patient agrees and acknowledges the above plan for discharge.      Cheri Fowler, PA-C 08/13/15 9604  Gilda Crease, MD 08/13/15 [phone]

## 2015-10-01 ENCOUNTER — Emergency Department (HOSPITAL_COMMUNITY)
Admission: EM | Admit: 2015-10-01 | Discharge: 2015-10-01 | Disposition: A | Discharge status: Home / Self Care | Payer: Medicaid Other | Attending: Emergency Medicine | Admitting: Emergency Medicine

## 2015-10-01 ENCOUNTER — Encounter (HOSPITAL_COMMUNITY): Payer: Self-pay | Admitting: *Deleted

## 2015-10-01 DIAGNOSIS — N938 Other specified abnormal uterine and vaginal bleeding: Secondary | ICD-10-CM | POA: Insufficient documentation

## 2015-10-01 DIAGNOSIS — Z79899 Other long term (current) drug therapy: Secondary | ICD-10-CM | POA: Insufficient documentation

## 2015-10-01 LAB — URINALYSIS, ROUTINE W REFLEX MICROSCOPIC
BILIRUBIN URINE: NEGATIVE
GLUCOSE, UA: NEGATIVE mg/dL
KETONES UR: NEGATIVE mg/dL
LEUKOCYTES UA: NEGATIVE
Nitrite: NEGATIVE
PH: 6 (ref 5.0–8.0)
Protein, ur: NEGATIVE mg/dL
Specific Gravity, Urine: 1.02 (ref 1.005–1.030)

## 2015-10-01 LAB — WET PREP, GENITAL
Clue Cells Wet Prep HPF POC: NONE SEEN
SPERM: NONE SEEN
TRICH WET PREP: NONE SEEN
WBC, Wet Prep HPF POC: NONE SEEN
Yeast Wet Prep HPF POC: NONE SEEN

## 2015-10-01 LAB — URINE MICROSCOPIC-ADD ON: WBC, UA: NONE SEEN WBC/hpf (ref 0–5)

## 2015-10-01 LAB — PREGNANCY, URINE: Preg Test, Ur: NEGATIVE

## 2015-10-01 MED ORDER — IBUPROFEN 600 MG PO TABS: 600.0000 mg | ORAL_TABLET | Freq: Four times a day (QID) | ORAL | Status: DC | PRN

## 2015-10-01 MED ORDER — TRAMADOL HCL 50 MG PO TABS: 100.0000 mg | ORAL_TABLET | Freq: Four times a day (QID) | ORAL | Status: DC | PRN

## 2015-10-01 MED ORDER — TRAMADOL HCL 50 MG PO TABS
100.0000 mg | ORAL_TABLET | Freq: Once | ORAL | Status: AC
  Administered 2015-10-01: 100 mg via ORAL
  Filled 2015-10-01: qty 2

## 2015-10-01 MED ORDER — IBUPROFEN 800 MG PO TABS
800.0000 mg | ORAL_TABLET | Freq: Once | ORAL | Status: AC
  Administered 2015-10-01: 800 mg via ORAL
  Filled 2015-10-01: qty 1

## 2015-10-01 NOTE — ED Provider Notes (Signed)
CSN: [id number]     Arrival date & time 10/01/15  0001 History   By signing my name below, I, Suzan SlickAshley N. Elon SpannerLeger, attest that this documentation has been prepared under the direction and in the presence of Arby BarretteMarcy Ichael Pullara, MD.  Electronically Signed: Suzan SlickAshley N. Elon SpannerLeger, ED Scribe. 10/01/2015. 3:38 AM.   Chief Complaint  Patient presents with  . Vaginal Bleeding   HPI  HPI Comments: Denise Avery is a 20 y.o. female without any pertinent past medical history who presents to the Emergency Department complaining of constant, unchanged vaginal bleeding x 3 weeks. She states flow is inconsistent with significant amount of blood some days and minimal blood other days. Per triage note, on average, she is using approximately 7 tampons daily. She also reports intermittent, sharp lower abdominal pain and nausea. Pt has an implanted birth control which was initially placed 1.5 years ago. She denies any issues since placement. No ongoing bleeding since first 3 months after placement. No recent fever or chills. No know allergies to medications.  PCP: No PCP Per Patient    Past Medical History  Diagnosis Date  . 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 Mastitis    Past Surgical History  Procedure Laterality Date  . Cesarean section      emergent at 8 mos.   . Cesarean section     Family History  Problem Relation Age of Onset  . Diabetes Maternal Grandmother    Social History  Substance Use Topics  . Smoking status: Never Smoker   . Smokeless tobacco: Never Used  . Alcohol Use: No   OB History    Gravida Para Term Preterm AB TAB SAB Ectopic Multiple Living   2 2 1 1  0 0 0 0 0 2     Review of Systems  A complete 10 system review of systems was obtained and all systems are negative except as noted in the HPI and PMH.    Allergies  Review of patient's allergies indicates no known allergies.  Home Medications   Prior to Admission medications   Medication Sig Start Date End Date Taking?  Authorizing Provider  acetaminophen (TYLENOL) 500 MG tablet Take 1 tablet (500 mg total) by mouth every 6 (six) hours as needed. 08/13/15   Cheri FowlerKayla Rose, PA-C  amoxicillin (AMOXIL) 500 MG capsule Take 1 capsule (500 mg total) by mouth 3 (three) times daily. Patient not taking: Reported on 08/13/2015 06/22/15   Roxy Horsemanobert Browning, PA-C  cephALEXin (KEFLEX) 500 MG capsule Take 1 capsule (500 mg total) by mouth 4 (four) times daily. 08/13/15   Cheri FowlerKayla Rose, PA-C  ibuprofen (ADVIL,MOTRIN) 600 MG tablet Take 1 tablet (600 mg total) by mouth every 6 (six) hours as needed. 10/01/15   Arby BarretteMarcy Laurel Smeltz, MD  Prenatal Vit-Fe Fumarate-FA (PRENATAL VITAMINS PLUS) 27-1 MG TABS Take 1 tablet by mouth daily. Patient not taking: Reported on 06/03/2015 02/16/14   Bertram DenverKaren E Teague Clark, PA-C  traMADol (ULTRAM) 50 MG tablet Take 2 tablets (100 mg total) by mouth every 6 (six) hours as needed. 10/01/15   Arby BarretteMarcy Harun Brumley, MD   Triage Vitals: BP 118/67 mmHg  Pulse 105  Temp(Src) 97.9 F (36.6 C) (Oral)  Resp 18  SpO2 100%   Physical Exam  Constitutional: She is oriented to person, place, and time. She appears well-developed and well-nourished.  HENT:  Head: Normocephalic.  Eyes: EOM are normal.  Neck: Normal range of motion.  Cardiovascular: Normal rate, regular rhythm and normal heart  sounds.  Exam reveals no gallop and no friction rub.   No murmur heard. Pulmonary/Chest: Effort normal and breath sounds normal. She has no wheezes. She has no rales.  Abdominal: Soft. She exhibits no distension. There is tenderness.  Suprapubic tenderness noted.  Genitourinary:  No CVA tenderness.  External genitalia normal. Psychiatric exam, small to moderate amount of blood in the vaginal vault. No pooling and no active bleeding from the cervix. No discharge. Bimanual examination diffuse moderate tenderness. No palpable mass  Musculoskeletal: Normal range of motion. She exhibits no edema or tenderness.  Neurological: She is alert and oriented  to person, place, and time.  Skin: Skin is warm and dry.  Psychiatric: She has a normal mood and affect.  Nursing note and vitals reviewed.   ED Course  Procedures (including critical care time)  DIAGNOSTIC STUDIES: Oxygen Saturation is 100% on RA, Normal by my interpretation.    COORDINATION OF CARE: 3:37 AM- Will order urinalysis. Will perform pelvic examination. Discussed treatment plan with pt at bedside and pt agreed to plan.     Labs Review Labs Reviewed  URINALYSIS, ROUTINE W REFLEX MICROSCOPIC (NOT AT [facility]) - Abnormal; Notable for the following:    APPearance CLOUDY (*)    Hgb urine dipstick SMALL (*)    All other components within normal limits  URINE MICROSCOPIC-ADD ON - Abnormal; Notable for the following:    Squamous Epithelial / LPF 0-5 (*)    Bacteria, UA RARE (*)    All other components within normal limits  WET PREP, GENITAL  PREGNANCY, URINE  GC/CHLAMYDIA PROBE AMP ([place]) NOT AT [facility]    Imaging Review No results found. I have personally reviewed and evaluated these images and lab results as part of my medical decision-making.   EKG Interpretation None      MDM   Final diagnoses:  Dysfunctional uterine bleeding   The patient has developed vaginal bleeding that has been sporadic for approximately 3 weeks. No pregnancy. Urinalysis negative. Wet prep does not show leukocytosis or other positive findings. At this time I suspect this is secondary to her implanted birth control and hormonal irregularity. Patient is counseled to follow-up with a gynecologist. Resource for women's clinic is provided. Signs and symptoms to return are reviewed. Ibuprofen and tramadol given for pelvic pain. Gonorrhea and chlamydia will be pending. At this time I do not opt to treat empirically.  Arby Barrette, MD 10/01/15 [phone]

## 2015-10-01 NOTE — ED Notes (Signed)
Via spanish interpreter, pt states that she has been having odorous vaginal bleeding for about 3 weeks, associated with nausea, she changes about 7 tampons a day. Pt has implant in her arm and has not had a period for about 1.5 years until she started bleeding.

## 2015-10-01 NOTE — Discharge Instructions (Signed)
Metrorragia funcional (Dysfunctional Uterine Bleeding) La metrorragia funcional es una hemorragia anormal proveniente del tero. La metrorragia funcional incluye estos sntomas:  Menstruacin que se adelanta o se atrasa.  Menstruacin menos o ms abundante, o con cogulos sanguneos.  Hemorragias entre los perodos [employer].  Ausencia de una o ms menstruaciones.  Hemorragias luego de Sales promotion account executive.  Sangrado luego de la menopausia. INSTRUCCIONES PARA EL CUIDADO EN EL HOGAR  Est atenta a cualquier cambio en los sntomas. Estas indicaciones pueden ayudarla con el trastorno: Comidas  Siga una dieta equilibrada. Incluya alimentos con [employer]Ex de hierro, como hgado, carne, Oceanographer, verduras de hoja verde y [place].  Si tiene estreimiento:  Beba abundante agua.  Consuma frutas y verduras con alto contenido de agua y [place], [place] espinaca, zanahorias, frambuesas, manzanas y mango. Medicamentos  [employer] de venta libre y los recetados solamente como se lo haya indicado el mdico.  No haga cambios en los medicamentos sin hablar con el mdico.  La aspirina o los medicamentos que la contienen pueden aumentar la hemorragia. No tome esos medicamentos:  Durante la semana previa a Tax adviser.  Durante la [employer].  Si le recetaron comprimidos de hierro, Scientist, forensic se lo haya indicado el mdico. Estos ayudan a Restaurant manager, fast food hierro que el organismo pierde debido a este trastorno. Actividad  Si debe cambiarse el apsito o el tampn ms de una vez cada 2horas:  Acustese con los pies elevados.  Colquese una compresa fra en la parte baja del abdomen.  Haga todo el reposo que pueda hasta que la hemorragia se detenga o disminuya.  No trate de Management consultant que la hemorragia se detenga y los niveles de hierro en la sangre se normalicen. Otras indicaciones  [employer], anote lo siguiente:  La fecha de comienzo de Barrister's clerk.  La fecha de su finalizacin.  Los [employer] que tiene una hemorragia anormal.  Los problemas que advierte.  Concurra a todas las visitas de control como se lo haya indicado el mdico. Esto es importante. SOLICITE ATENCIN MDICA SI:  Se siente dbil o que va a desvanecerse.  Tiene nuseas y vmitos.  No puede comer ni beber sin vomitar.  Tiene mareos o diarrea mientras toma los medicamentos.  Est tomando anticonceptivos u hormonas, y desea cambiar o suspender estos medicamentos. SOLICITE ATENCIN MDICA DE INMEDIATO SI:  Tiene escalofros o fiebre.  Debe cambiarse el apsito o el tampn ms de una vez por hora.  La hemorragia se vuelve ms abundante o el flujo menstrual contiene cogulos con ms frecuencia.  Siente dolor en el abdomen.  Pierde la conciencia.  Le aparece una erupcin cutnea.   Esta informacin no tiene Theme park manager el consejo del mdico. Asegrese de hacerle al mdico cualquier pregunta que tenga.  [employer]state The United Ways 211 is a great source of information about community services available.  Access by dialing 2-1-1 from anywhere in [place], or by website -  PooledIncome.pl.   Other Local Resources (Updated 03/2015)  Financial Assistance   Services    Phone Number and Address  [facility]  Low-cost medical care - 1st and 3rd Saturday of every month  Must not qualify for public or private insurance and must have limited income [phone] [address] [place], [place]    [place] [employer] of Social Services  Child care  Emergency assistance for housing and [employer]  Medicaid [phone] [address] [place], [place] [address]   [facility]  Low-cost medical care for children, communicable diseases, sexually-transmitted diseases, immunizations, maternity care, womens health and family  planning [phone] [place], [place] [address]  [facility]   Medication assistance for [place] residents  Must meet income requirements [phone] [address] [place], [place].    [facility]  Child care  Emergency assistance for housing and Kimberly-Clarkutilities  Food stamps  Medicaid [phone] [address] [place], [place] [address]  [facility]   Low-cost medical care,   Monday through Friday, 9 am to 6 pm.   Accepts Medicare/Medicaid, and self-pay [phone] [address] [place], [place] [address]  [facility]  Low-cost medical care - Monday through Friday, 8:30 am - 5:30 pm  Accepts Medicaid and self-pay [phone] [address] [place], [place] [address]   [facility]  Primary medical care, including for those with sickle cell disease  Accepts Medicare, Medicaid, insurance and self-pay [phone] [address] [place], [place]  [facility]   Primary medical care  Accepts Medicare, [place], insurance and self-pay [phone] [address], [address] [place], [place] [address]   [facility]  Child care  Emergency assistance for housing and Kimberly-Clarkutilities  Food stamps  Medicaid [phone] [address] [place], [place] [address]  [facility] and CarMaxHuman Services  Child care  Emergency assistance for housing and Kimberly-Clarkutilities  Food stamps  Medicaid [phone] [address] [place], [place] [address]   [facility]  Medication assistance for [facility] [place] residents with no insurance only  Must have a primary care doctor [phone] [address] [place], [place]  [address] [facility]   Primary medical care  [place]s  Medicare, [place]icaid, insurance  [phone] [address] [place], [place]  MedAssist   Medication assistance [phone]  [facility]   Primary medical care  Accepts Medicare, [place], insurance and self-pay [phone] [place], [place] [address]  [facility]   Primary medical care  Accepts Medicare, [place], insurance and self-pay [phone] [address] [place], [place] [address]  [facility]  For [place] residents between the ages of 7518 and 5164 who do not have any form of health insurance, Medicare, [place]icaid, or [place] benefits.  Services are provided free of charge to uninsured patients who fall within federal poverty guidelines.    Hours: Tuesdays and Thursdays, 4:15 - 8 pm [phone] [address], [address] [place], [place] [address]  [facility]     Primary medical care  Dental care  Nutritional counseling  Pharmacy  Accepts Medicaid, Medicare, most insurance.  Fees are adjusted based on ability to pay.   [phone] [facility] [address] [place], [place]  [phone] [facility] Realharles Drew [facility] [address] [place], [place]  [phone] [facility] [place], [place]  [phone] [facility], [address] [place], [place]  [phone] [facility] [address] [place], [place]  [facility]  Womens health and family planning [phone] [place], [place]  [facility] Social Services  Child care  Emergency assistance for housing and Kimberly-Clarkutilities  Food stamps  Medicaid [phone] [address], [place], [place] [address]   [facility]    Ages 7 and older  Hours: Mondays and Thursdays, 7:00 am - 9:00 am Patients are seen on a first come, first served basis. [phone],  [phone] [place], [place]  [facility] Social Services  Child care  Emergency assistance for housing and [employer]ly[employer]ark  Medicaid [phone] [address] [place], [place] [address]  [facility]  Medication assistance  Rental assistance  Food pantry  Medication assistance  Housing assistance  Emergency food distribution  Utility assistance [phone] [address] [place], [place]  [phone]  [address] [place], [place] [address] Hours: Tuesdays and Thursdays from 9am - 12 noon by appointment only  [phone] [address] [place], [place] [address]  [facility] - Lanae Boast   Accepts private insurance, [place], and [place].  Payment is based on a sliding scale for those without insurance.  Hours: Mondays, Tuesdays and Thursdays, 8:30 am - 5:30 pm.   [phone] [address] [place], [place]  [facility] - Family Medicine at [facility], [place], and [place].  Payment is based on a sliding scale for those without insurance. [phone] [address] [place], [place]  [facility] - Pediatrics at E. Scientist, research (physical sciences), [employer], and [place].  Payment is based on a sliding scale for those without insurance [phone] [address], [employer], [place]  [facility] - Pediatrics at [employer], [place], and [place].  Payment is based on a sliding scale for those without insurance. [phone] [address] [place], [place]  [facility] - Pediatrics at [facility], [place], and [place].  Payment is based on a sliding scale for those without insurance. [phone] [place], [place].    [facility]   Maternity care.  Accepts Medicaid and self-pay. [phone] [address] [place], [place]

## 2015-10-02 LAB — GC/CHLAMYDIA PROBE AMP (~~LOC~~) NOT AT ARMC
Chlamydia: NEGATIVE
Neisseria Gonorrhea: NEGATIVE

## 2017-02-25 ENCOUNTER — Encounter: Payer: Self-pay | Admitting: Medical

## 2017-02-25 ENCOUNTER — Ambulatory Visit (INDEPENDENT_AMBULATORY_CARE_PROVIDER_SITE_OTHER): Payer: Self-pay | Admitting: Medical

## 2017-02-25 VITALS — BP 121/63 | HR 80 | Ht 61.0 in | Wt 140.9 lb

## 2017-02-25 DIAGNOSIS — N939 Abnormal uterine and vaginal bleeding, unspecified: Secondary | ICD-10-CM

## 2017-02-25 NOTE — Progress Notes (Signed)
  Small papules Macular rash Excoriations  Hydrocortisone cream Moisturizer Warm shower, not hot  AUB with nexplanon  Plan to return next week for removal and re-insertion of nexplanon  History:  [name] is a 21 y.o. [id number] who presents to clinic today for abnormal uterine bleeding and rash on arms. The patient had Nexplanon placed in February 2016. She has noted a small rash on her bilateral lower arms x 2 weeks and was concerned it was related to the Nexplanon. She has not tried any medication for this. She also states her periods have become irregular in the last few months. She has had 2-3 episodes of bleeding per month since July.    The following portions of the patient's history were reviewed and updated as appropriate: allergies, current medications, family history, past medical history, social history, past surgical history and problem list.  Review of Systems:  Review of Systems  Constitutional: Negative for fever and malaise/fatigue.  Gastrointestinal: Positive for abdominal pain. Negative for constipation, diarrhea, nausea and vomiting.  Skin: Positive for itching and rash.      Objective:  Physical Exam BP 121/63   Pulse 80   Ht 5\' 1"  (1.549 m)   Wt 140 lb 14.4 oz (63.9 kg)   LMP 02/21/2017   BMI 26.62 kg/m  Physical Exam  Constitutional: She is oriented to person, place, and time. She appears well-developed and well-nourished. No distress.  HENT:  Head: Normocephalic.  Eyes: EOM are normal.  Neck: Normal range of motion.  Cardiovascular: Normal rate.  Pulmonary/Chest: Effort normal.  Abdominal: Soft.  Neurological: She is alert and oriented to person, place, and time.  Skin: Skin is warm and dry. Rash noted. Rash is macular and papular. No erythema.     Psychiatric: She has a normal mood and affect.  Vitals reviewed.   Assessment & Plan:  1. Abnormal uterine bleeding Discussed options of OCPs to help with bleeding control vs replacing  Nexplanon now Patient would like to replace the Nexplanon but is unable to do so today because she has to go to work Patient to be scheduled to return in 1 week for Nexplanon removal and re-insertion  2. Rash, allergic - Rash resembles allergic dermatitis - Advised OTC hydrocortisone cream for symptom management 1-2 times daily    Denise Avery, Denise Holben N, PA-C 02/25/2017 4:48 PM

## 2017-02-25 NOTE — Progress Notes (Signed)
History:  [name] is a 21 y.o. [id number] who presents to clinic today for abnormal uterine bleeding, abdominal pain, and rash. She states her abnormal bleeding has been going on for 3-4 months. States she will bleed for a week at a time about 3 times per month with several days of non bleeding in between. When she does bleed she endorses having to change her pad at least 5 times a day. She attributes her abdominal pain to cramping when she bleeds. Her rash began approximately 2 weeks ago and is found bilaterally on both arms below the forearm and can be described as dermatitis like. Patient had similar symptoms in July and sought ED attention. [facility] but never did because she states her symptoms went away. Patient has had Nexplanon inserted since Feb. 17 2016 and plans to have it exchanged and replaced. Patient endorses some nausea and dizziness. Patient denies fever, dysuria, diarrhea, and vomiting.   The following portions of the patient's history were reviewed and updated as appropriate: allergies, current medications, family history, past medical history, social history, past surgical history and problem list.  Review of Systems:  ROS All pertinent positives and negatives listed in HPI   Objective:  Physical Exam BP 121/63   Pulse 80   Ht 5\' 1"  (1.549 m)   Wt 140 lb 14.4 oz (63.9 kg)   LMP 02/21/2017   BMI 26.62 kg/m  Physical Exam  General:  Alert, oriented and cooperative. Patient is in no acute distress.  Skin: Skin is warm and dry. Rash noted bilaterally on anterior area of forearms  Cardiovascular: Normal heart rate noted  Respiratory: Normal respiratory effort, no problems with respiration noted  Extremities: Normal range of motion.    Mental Status:  Normal mood and affect. Normal behavior. Normal judgment and thought content.      Labs and Imaging No results found for this or any previous visit (from the past 24  hour(s)).  No results found.   Assessment & Plan:  1. Abnormal uterine bleeding -Most likely due to Nexplanon expiring  -Offered patient option to have Nexplanon exchanged today or next week, patient chose to make appointment for next week  2. Bilateral rash on forearms -Rash most likely not related to Nexplanon due to rash late onset in Nexplanon course. -Dermatitis vs. Seasonal changes -Instructed patient to apply Hydrocortisone cream 1-2 times daily for pruritis and rash irritation.  -Also discussed bathing in warm temperature water and avoid hot water to bathing to prevent skin drying out.    Jonette EvaLum, Denise Avery, Student-PA 02/25/2017 3:20 PM

## 2017-02-25 NOTE — Patient Instructions (Signed)

## 2017-02-25 NOTE — Progress Notes (Signed)
Stratus interpreter [place] [phone]

## 2017-03-05 ENCOUNTER — Ambulatory Visit: Payer: Self-pay | Admitting: Medical

## 2017-12-09 ENCOUNTER — Other Ambulatory Visit: Payer: Self-pay

## 2017-12-09 ENCOUNTER — Emergency Department (HOSPITAL_COMMUNITY)
Admission: EM | Admit: 2017-12-09 | Discharge: 2017-12-10 | Disposition: A | Discharge status: Home / Self Care | Payer: Self-pay | Attending: Emergency Medicine | Admitting: Emergency Medicine

## 2017-12-09 ENCOUNTER — Encounter (HOSPITAL_COMMUNITY): Payer: Self-pay | Admitting: Emergency Medicine

## 2017-12-09 DIAGNOSIS — R1011 Right upper quadrant pain: Secondary | ICD-10-CM | POA: Insufficient documentation

## 2017-12-09 LAB — CBC
HCT: 38.4 % (ref 36.0–46.0)
Hemoglobin: 12.7 g/dL (ref 12.0–15.0)
MCH: 29.7 pg (ref 26.0–34.0)
MCHC: 33.1 g/dL (ref 30.0–36.0)
MCV: 89.9 fL (ref 78.0–100.0)
PLATELETS: 322 10*3/uL (ref 150–400)
RBC: 4.27 MIL/uL (ref 3.87–5.11)
RDW: 13.3 % (ref 11.5–15.5)
WBC: 8.9 10*3/uL (ref 4.0–10.5)

## 2017-12-09 LAB — COMPREHENSIVE METABOLIC PANEL
ALBUMIN: 4.1 g/dL (ref 3.5–5.0)
ALT: 21 U/L (ref 0–44)
AST: 17 U/L (ref 15–41)
Alkaline Phosphatase: 62 U/L (ref 38–126)
Anion gap: 10 (ref 5–15)
BILIRUBIN TOTAL: 0.4 mg/dL (ref 0.3–1.2)
BUN: 10 mg/dL (ref 6–20)
CALCIUM: 9.3 mg/dL (ref 8.9–10.3)
CO2: 23 mmol/L (ref 22–32)
CREATININE: 0.8 mg/dL (ref 0.44–1.00)
Chloride: 105 mmol/L (ref 98–111)
GFR calc Af Amer: >60 mL/min (ref >60)
GFR calc non Af Amer: >60 mL/min (ref >60)
GLUCOSE: 103 mg/dL — AB (ref 70–99)
POTASSIUM: 3.7 mmol/L (ref 3.5–5.1)
Sodium: 138 mmol/L (ref 135–145)
TOTAL PROTEIN: 7 g/dL (ref 6.5–8.1)

## 2017-12-09 LAB — LIPASE, BLOOD: Lipase: 22 U/L (ref 11–51)

## 2017-12-09 LAB — I-STAT BETA HCG BLOOD, ED (MC, WL, AP ONLY)

## 2017-12-09 NOTE — ED Triage Notes (Signed)
Pt c/o intermittent abdominal pain with nausea x 1 week. Denies vomiting/diarrhea.

## 2017-12-10 ENCOUNTER — Other Ambulatory Visit: Payer: Self-pay

## 2017-12-10 ENCOUNTER — Emergency Department (HOSPITAL_COMMUNITY): Payer: Self-pay

## 2017-12-10 LAB — URINALYSIS, ROUTINE W REFLEX MICROSCOPIC
Bilirubin Urine: NEGATIVE
GLUCOSE, UA: NEGATIVE mg/dL
Hgb urine dipstick: NEGATIVE
KETONES UR: NEGATIVE mg/dL
LEUKOCYTES UA: NEGATIVE
NITRITE: NEGATIVE
PROTEIN: NEGATIVE mg/dL
Specific Gravity, Urine: 1.012 (ref 1.005–1.030)
pH: 5 (ref 5.0–8.0)

## 2017-12-10 MED ORDER — FAMOTIDINE 20 MG PO TABS: 20.0000 mg | ORAL_TABLET | Freq: Two times a day (BID) | ORAL | 0 refills | Status: DC

## 2017-12-10 MED ORDER — GI COCKTAIL ~~LOC~~
30.0000 mL | Freq: Once | ORAL | Status: AC
  Administered 2017-12-10: 30 mL via ORAL
  Filled 2017-12-10: qty 30

## 2017-12-10 MED ORDER — HYDROCODONE-ACETAMINOPHEN 5-325 MG PO TABS
1.0000 | ORAL_TABLET | Freq: Once | ORAL | Status: AC
  Administered 2017-12-10: 1 via ORAL
  Filled 2017-12-10: qty 1

## 2017-12-10 NOTE — ED Notes (Signed)
Reviewed d/c instructions with pt, who verbalized understanding and had no outstanding questions. Pt departed in NAD, refused use of wheelchair.   

## 2017-12-10 NOTE — ED Notes (Signed)
ED Provider at bedside, using tablet interpreter.

## 2017-12-10 NOTE — ED Notes (Signed)
Patient transported to Ultrasound 

## 2017-12-10 NOTE — ED Provider Notes (Signed)
[facility] EMERGENCY DEPARTMENT Provider Note   CSN: [id number] Arrival date & time: 12/09/17  2037     History   Chief Complaint Chief Complaint  Patient presents with  . Abdominal Pain    HPI Denise Avery is a 22 y.o. female.  The history is provided by the patient. A language interpreter was used [phone]).  Abdominal Pain   This is a new problem. The current episode started more than 2 days ago. The problem occurs daily. The pain is associated with eating. The pain is located in the generalized abdominal region. The pain is severe. Associated symptoms include nausea and headaches. Pertinent negatives include fever, diarrhea, vomiting and dysuria. The symptoms are aggravated by certain positions. Nothing relieves the symptoms.   Pt reports abd pain for past week It seems worse with eating She has been taking NSAIDs for pain  Past Medical History:  Diagnosis Date  . Mastitis   . 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 Exposure to TB 02/10/2014  . History of cesarean delivery affecting pregnancy 02/02/2014    Past Surgical History:  Procedure Laterality Date  . CESAREAN SECTION     emergent at 8 mos.   . CESAREAN SECTION       OB History    Gravida  2   Para  2   Term  1   Preterm  1   AB  0   Living  2     SAB  0   TAB  0   Ectopic  0   Multiple  0   Live Births  2            Home Medications    Prior to Admission medications   Not on File    Family History Family History  Problem Relation Age of Onset  . Diabetes Maternal Grandmother     Social History Social History   Tobacco Use  . Smoking status: Never Smoker  . Smokeless tobacco: Never Used  Substance Use Topics  . Alcohol use: No  . Drug use: No     Allergies   Patient has no known allergies.   Review of Systems Review of Systems  Constitutional: Negative for fever.  Cardiovascular:  Negative for chest pain.  Gastrointestinal: Positive for abdominal pain and nausea. Negative for blood in stool, diarrhea and vomiting.  Genitourinary: Negative for dysuria, vaginal bleeding and vaginal discharge.  Neurological: Positive for headaches.  All other systems reviewed and are negative.    Physical Exam Updated Vital Signs BP 119/81 (BP Location: Right Arm)   Pulse 87   Temp 97.7 F (36.5 C) (Oral)   Resp 16   Wt 70.8 kg   SpO2 100%   BMI 29.48 kg/m   Physical Exam CONSTITUTIONAL: Well developed/well nourished HEAD: Normocephalic/atraumatic EYES: EOMI/PERRL ENMT: Mucous membranes moist NECK: supple no meningeal signs SPINE/BACK:entire spine nontender CV: S1/S2 noted, no murmurs/rubs/gallops noted LUNGS: Lungs are clear to auscultation bilaterally, no apparent distress ABDOMEN: soft, moderate RUQ/epigatric region, no rebound or guarding, bowel sounds noted throughout abdomen GU:no cva tenderness NEURO: Pt is awake/alert/appropriate, moves all extremitiesx4.  No facial droop.   EXTREMITIES: pulses normal/equal, full ROM SKIN: warm, color normal PSYCH: no abnormalities of mood noted, alert and oriented to situation   ED Treatments / Results  Labs (all labs ordered are listed, but only abnormal results are displayed) Labs Reviewed  COMPREHENSIVE METABOLIC PANEL - Abnormal; Notable for the following components:      Result Value   Glucose, Bld 103 (*)    All other components within normal limits  LIPASE, BLOOD  CBC  URINALYSIS, ROUTINE W REFLEX MICROSCOPIC  I-STAT BETA HCG BLOOD, ED ([facility], [facility], [facility] ONLY)    EKG None  Radiology No results found.  Procedures Procedures    Medications Ordered in ED Medications  HYDROcodone-acetaminophen (NORCO/VICODIN) 5-325 MG per tablet 1 tablet (has no administration in time range)  gi cocktail (Maalox,Lidocaine,Donnatal) (30 mLs Oral Given 12/10/17 0236)     Initial Impression / Assessment and Plan / ED Course  I  have reviewed the triage vital signs and the nursing notes.  Pertinent labs & imaging results that were available during my care of the patient were reviewed by me and considered in my medical decision making (see chart for details).     3:45 AM Will obtain RUQ US Pt stable at this time PT declined any pain medicine 6:09 AM Patient is improved.  She is resting comfortably.  Ultrasound is negative.  I utilized interpreter to discuss follow-up instructions.  Advised her to avoid NSAIDs.  We will start her on Pepcid.  Discussed dietary changes.  We discussed strict ER return precautions . Final Clinical Impressions(s) / ED Diagnoses   Final diagnoses:  RUQ pain  Right upper quadrant abdominal pain    ED Discharge Orders         Ordered    famotidine (PEPCID) 20 MG tablet  2 times daily     12/10/17 0601           Zadie RhineWickline, Melaine Mcphee, MD 12/10/17 [phone]

## 2017-12-22 ENCOUNTER — Emergency Department (HOSPITAL_COMMUNITY)
Admission: EM | Admit: 2017-12-22 | Discharge: 2017-12-23 | Disposition: A | Discharge status: Home / Self Care | Payer: Self-pay | Attending: Emergency Medicine | Admitting: Emergency Medicine

## 2017-12-22 ENCOUNTER — Encounter (HOSPITAL_COMMUNITY): Payer: Self-pay | Admitting: Emergency Medicine

## 2017-12-22 DIAGNOSIS — R1013 Epigastric pain: Secondary | ICD-10-CM | POA: Insufficient documentation

## 2017-12-22 LAB — URINALYSIS, ROUTINE W REFLEX MICROSCOPIC
BILIRUBIN URINE: NEGATIVE
Bacteria, UA: NONE SEEN
GLUCOSE, UA: NEGATIVE mg/dL
Ketones, ur: NEGATIVE mg/dL
LEUKOCYTES UA: NEGATIVE
NITRITE: NEGATIVE
PROTEIN: NEGATIVE mg/dL
Specific Gravity, Urine: 1.002 — ABNORMAL LOW (ref 1.005–1.030)
pH: 8 (ref 5.0–8.0)

## 2017-12-22 LAB — CBC
HCT: 36.8 % (ref 36.0–46.0)
HEMOGLOBIN: 12.3 g/dL (ref 12.0–15.0)
MCH: 29.6 pg (ref 26.0–34.0)
MCHC: 33.4 g/dL (ref 30.0–36.0)
MCV: 88.7 fL (ref 78.0–100.0)
PLATELETS: 309 10*3/uL (ref 150–400)
RBC: 4.15 MIL/uL (ref 3.87–5.11)
RDW: 13.2 % (ref 11.5–15.5)
WBC: 11.7 10*3/uL — AB (ref 4.0–10.5)

## 2017-12-22 LAB — COMPREHENSIVE METABOLIC PANEL
ALT: 30 U/L (ref 0–44)
ANION GAP: 6 (ref 5–15)
AST: 24 U/L (ref 15–41)
Albumin: 4 g/dL (ref 3.5–5.0)
Alkaline Phosphatase: 58 U/L (ref 38–126)
BILIRUBIN TOTAL: 0.5 mg/dL (ref 0.3–1.2)
BUN: 8 mg/dL (ref 6–20)
CO2: 22 mmol/L (ref 22–32)
CREATININE: 0.55 mg/dL (ref 0.44–1.00)
Calcium: 8.6 mg/dL — ABNORMAL LOW (ref 8.9–10.3)
Chloride: 106 mmol/L (ref 98–111)
GFR calc Af Amer: >60 mL/min (ref >60)
Glucose, Bld: 109 mg/dL — ABNORMAL HIGH (ref 70–99)
Potassium: 3.4 mmol/L — ABNORMAL LOW (ref 3.5–5.1)
Sodium: 134 mmol/L — ABNORMAL LOW (ref 135–145)
TOTAL PROTEIN: 6.8 g/dL (ref 6.5–8.1)

## 2017-12-22 LAB — LIPASE, BLOOD: Lipase: 26 U/L (ref 11–51)

## 2017-12-22 LAB — I-STAT BETA HCG BLOOD, ED (MC, WL, AP ONLY): I-stat hCG, quantitative: <5 m[IU]/mL (ref <5)

## 2017-12-22 MED ORDER — ONDANSETRON 4 MG PO TBDP
4.0000 mg | ORAL_TABLET | Freq: Once | ORAL | Status: AC | PRN
  Administered 2017-12-22: 4 mg via ORAL
  Filled 2017-12-22: qty 1

## 2017-12-22 MED ORDER — OXYCODONE-ACETAMINOPHEN 5-325 MG PO TABS
1.0000 | ORAL_TABLET | ORAL | Status: AC | PRN
  Administered 2017-12-22 – 2017-12-23 (×2): 1 via ORAL
  Filled 2017-12-22 (×2): qty 1

## 2017-12-22 NOTE — ED Triage Notes (Signed)
Pt presents with continued upper abd pain since last visit to ER where she was dx with ulcer and given Rx's; pt states she feels worse since starting the medication; pain now radiating around to back equally on each side; n/v, no diarrhea;

## 2017-12-23 ENCOUNTER — Emergency Department (HOSPITAL_COMMUNITY): Payer: Self-pay

## 2017-12-23 IMAGING — CR DG ABDOMEN ACUTE W/ 1V CHEST
3 series · 3 of 3 positions shown · non-contrast
Comparison: Chest radiograph dated [DATE]

CLINICAL DATA: Abdominal pain

EXAM:
DG ABDOMEN ACUTE W/ 1V CHEST

[chest pa]
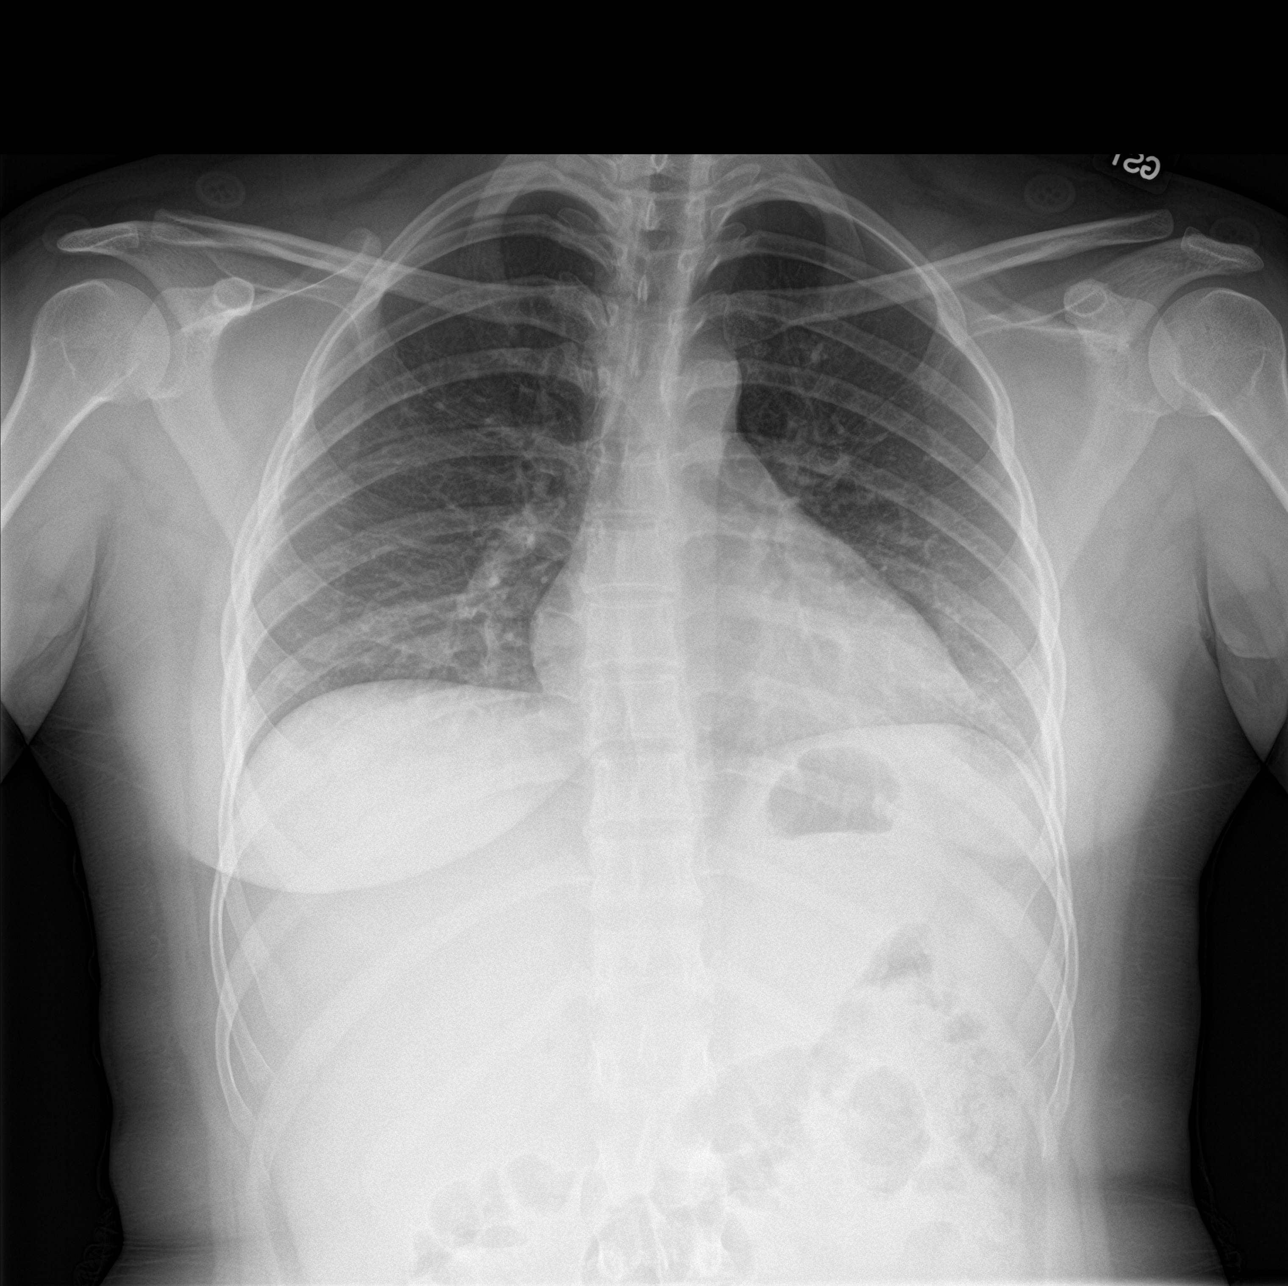

[abdomen erect]
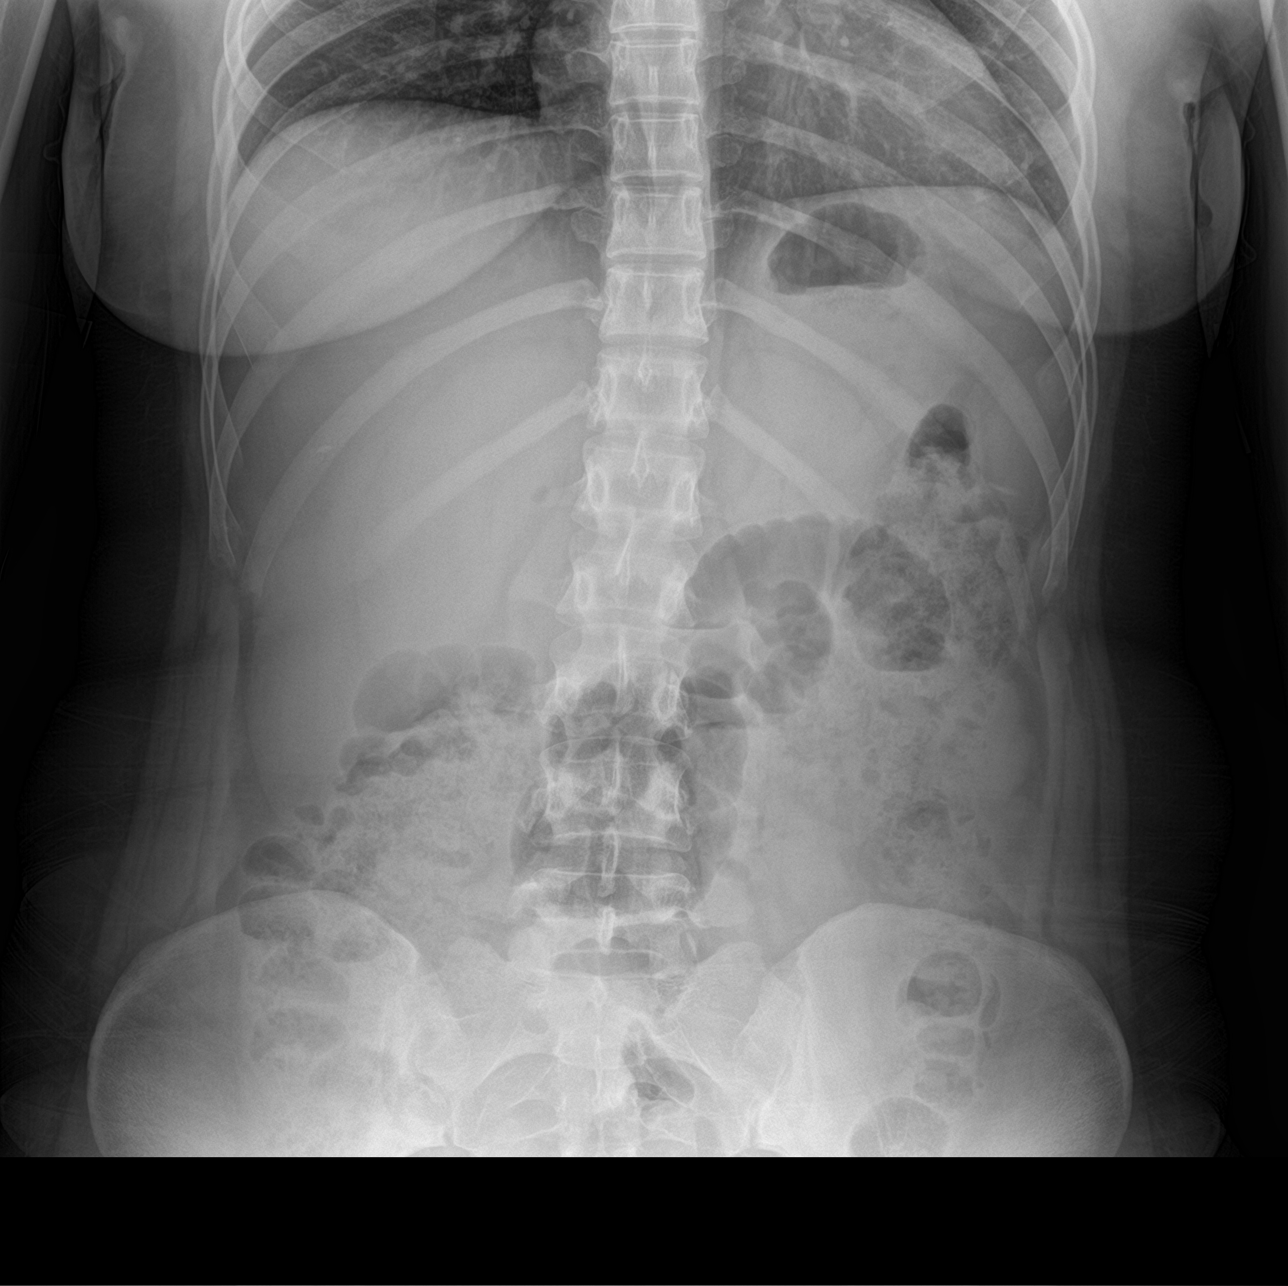

[abdomen supine]
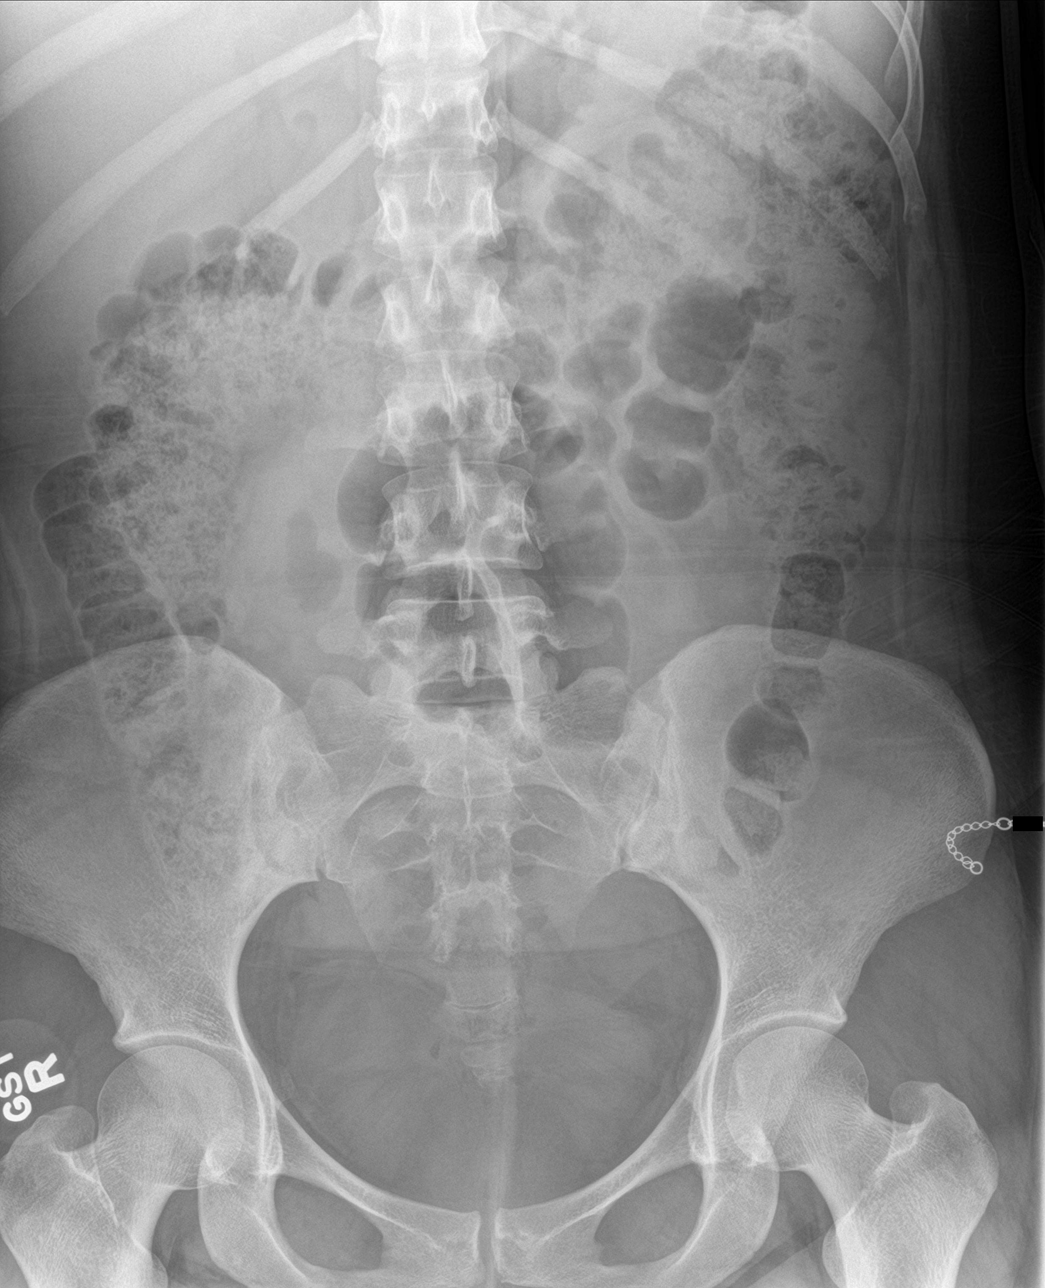

[3 of 3 positions shown; findings below may reference images not displayed]

FINDINGS: Lungs are clear.  No pleural effusion or pneumothorax.

The heart is normal in size.

Nonobstructive bowel gas pattern.

No evidence of free air under the diaphragm on the upright view.

Moderate colonic stool burden.

Visualized osseous structures are within normal limits.
IMPRESSION: No evidence of acute cardiopulmonary disease.

No evidence of small bowel obstruction or free air.

Moderate colonic stool burden, suggesting constipation.

## 2017-12-23 MED ORDER — SUCRALFATE 1 G PO TABS: 1.0000 g | ORAL_TABLET | Freq: Three times a day (TID) | ORAL | 0 refills | Status: DC

## 2017-12-23 MED ORDER — PANTOPRAZOLE SODIUM 40 MG PO TBEC
40.0000 mg | DELAYED_RELEASE_TABLET | Freq: Every day | ORAL | Status: DC
  Administered 2017-12-23: 40 mg via ORAL
  Filled 2017-12-23: qty 1

## 2017-12-23 MED ORDER — OMEPRAZOLE 20 MG PO CPDR: 20.0000 mg | DELAYED_RELEASE_CAPSULE | Freq: Two times a day (BID) | ORAL | 0 refills | Status: DC

## 2017-12-23 MED ORDER — SUCRALFATE 1 G PO TABS
1.0000 g | ORAL_TABLET | Freq: Once | ORAL | Status: AC
  Administered 2017-12-23: 1 g via ORAL
  Filled 2017-12-23: qty 1

## 2017-12-23 NOTE — ED Notes (Signed)
Patient transported to CT 

## 2017-12-23 NOTE — ED Notes (Signed)
Patient verbalizes understanding of discharge instructions. Opportunity for questioning and answers were provided. Armband removed by staff, pt discharged from ED home via POV.  

## 2017-12-23 NOTE — ED Provider Notes (Signed)
[facility] EMERGENCY DEPARTMENT Provider Note   CSN: [id number] Arrival date & time: 12/22/17  2216     History   Chief Complaint Chief Complaint  Patient presents with  . Abdominal Pain  . Back Pain  . Emesis    HPI Denise Avery is a 22 y.o. female.  Patient presents to the emergency department with a chief complaint of abdominal pain.  She reports epigastric abdominal pain.  Reports having had this for several weeks.  The pain is worsened with eating and when she lies down at night.  She was seen here approximately 1 week ago and was diagnosed with chemical ulcer.  She has been taking Pepcid with no relief.  She denies any fevers or chills.  She states that she did have an episode of vomiting.  She states the pain is continued to worsen.  She denies any other associated symptoms.  The history is provided by the patient. No language interpreter was used.    Past Medical History:  Diagnosis Date  . Mastitis   . 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 Exposure to TB 02/10/2014  . History of cesarean delivery affecting pregnancy 02/02/2014    Past Surgical History:  Procedure Laterality Date  . CESAREAN SECTION     emergent at 8 mos.   . CESAREAN SECTION       OB History    Gravida  2   Para  2   Term  1   Preterm  1   AB  0   Living  2     SAB  0   TAB  0   Ectopic  0   Multiple  0   Live Births  2            Home Medications    Prior to Admission medications   Medication Sig Start Date End Date Taking? Authorizing Provider  famotidine (PEPCID) 20 MG tablet Take 1 tablet (20 mg total) by mouth 2 (two) times daily. 12/10/17   Zadie Rhine, MD    Family History Family History  Problem Relation Age of Onset  . Diabetes Maternal Grandmother     Social History Social History   Tobacco Use  . Smoking status: Never Smoker  . Smokeless tobacco: Never Used    Substance Use Topics  . Alcohol use: No  . Drug use: No     Allergies   Patient has no known allergies.   Review of Systems Review of Systems  All other systems reviewed and are negative.    Physical Exam Updated Vital Signs BP 117/76   Pulse 84   Temp 98.2 F (36.8 C) (Oral)   Resp 16   SpO2 100%   Physical Exam  Constitutional: She is oriented to person, place, and time. She appears well-developed and well-nourished.  HENT:  Head: Normocephalic and atraumatic.  Eyes: Pupils are equal, round, and reactive to light. Conjunctivae and EOM are normal.  Neck: Normal range of motion. Neck supple.  Cardiovascular: Normal rate and regular rhythm. Exam reveals no gallop and no friction rub.  No murmur heard. Pulmonary/Chest: Effort normal and breath sounds normal. No respiratory distress. She has no wheezes. She has no rales. She exhibits no tenderness.  Abdominal: Soft. Bowel sounds are normal. She exhibits no distension and no mass. There is no tenderness. There is no rebound and no guarding.  No focal abdominal tenderness  Musculoskeletal: Normal range of motion. She exhibits no edema or tenderness.  Neurological: She is alert and oriented to person, place, and time.  Skin: Skin is warm and dry.  Psychiatric: She has a normal mood and affect. Her behavior is normal. Judgment and thought content normal.  Nursing note and vitals reviewed.    ED Treatments / Results  Labs (all labs ordered are listed, but only abnormal results are displayed) Labs Reviewed  COMPREHENSIVE METABOLIC PANEL - Abnormal; Notable for the following components:      Result Value   Sodium 134 (*)    Potassium 3.4 (*)    Glucose, Bld 109 (*)    Calcium 8.6 (*)    All other components within normal limits  CBC - Abnormal; Notable for the following components:   WBC 11.7 (*)    All other components within normal limits  URINALYSIS, ROUTINE W REFLEX MICROSCOPIC - Abnormal; Notable for the  following components:   Color, Urine COLORLESS (*)    Specific Gravity, Urine 1.002 (*)    Hgb urine dipstick SMALL (*)    All other components within normal limits  LIPASE, BLOOD  I-STAT BETA HCG BLOOD, ED ([facility], [facility], [facility] ONLY)    EKG None  Radiology No results found.  Procedures Procedures (including critical care time)  Medications Ordered in ED Medications  oxyCODONE-acetaminophen (PERCOCET/ROXICET) 5-325 MG per tablet 1 tablet (1 tablet Oral Given 12/22/17 2237)  sucralfate (CARAFATE) tablet 1 g (has no administration in time range)  pantoprazole (PROTONIX) EC tablet 40 mg (has no administration in time range)  ondansetron (ZOFRAN-ODT) disintegrating tablet 4 mg (4 mg Oral Given 12/22/17 2237)     Initial Impression / Assessment and Plan / ED Course  I have reviewed the triage vital signs and the nursing notes.  Pertinent labs & imaging results that were available during my care of the patient were reviewed by me and considered in my medical decision making (see chart for details).     With epigastric abdominal discomfort, worse after eating, worse when lying down at night.  She has no lower abdominal tenderness, and no Murphy sign, doubt appendicitis or cholecystitis.  I do believe that she has a stomach ulcer.  Will check abdominal plain films to rule out free air.  Will treat with Carafate and omeprazole.  Recommend gastroenterology follow-up.  Plain films show no evidence of free air.  DC to home.  Final Clinical Impressions(s) / ED Diagnoses   Final diagnoses:  Epigastric pain    ED Discharge Orders         Ordered    sucralfate (CARAFATE) 1 g tablet  3 times daily with meals & bedtime     12/23/17 0151    omeprazole (PRILOSEC) 20 MG capsule  2 times daily before meals     12/23/17 0151           Roxy Horseman, PA-C 12/23/17 0152    Nira Conn, MD 12/23/17 1650

## 2018-12-08 ENCOUNTER — Emergency Department (HOSPITAL_COMMUNITY)
Admission: EM | Admit: 2018-12-08 | Discharge: 2018-12-09 | Discharge status: Against Medical Advice | Payer: Self-pay | Attending: Emergency Medicine | Admitting: Emergency Medicine

## 2018-12-08 ENCOUNTER — Encounter (HOSPITAL_COMMUNITY): Payer: Self-pay | Admitting: Emergency Medicine

## 2018-12-08 DIAGNOSIS — Z5321 Procedure and treatment not carried out due to patient leaving prior to being seen by health care provider: Secondary | ICD-10-CM | POA: Insufficient documentation

## 2018-12-08 LAB — CBC
HCT: 37.2 % (ref 36.0–46.0)
Hemoglobin: 12.8 g/dL (ref 12.0–15.0)
MCH: 30.5 pg (ref 26.0–34.0)
MCHC: 34.4 g/dL (ref 30.0–36.0)
MCV: 88.6 fL (ref 80.0–100.0)
Platelets: 321 10*3/uL (ref 150–400)
RBC: 4.2 MIL/uL (ref 3.87–5.11)
RDW: 13.3 % (ref 11.5–15.5)
WBC: 9.7 10*3/uL (ref 4.0–10.5)
nRBC: 0 % (ref 0.0–0.2)

## 2018-12-08 LAB — URINALYSIS, ROUTINE W REFLEX MICROSCOPIC
Bilirubin Urine: NEGATIVE
Glucose, UA: NEGATIVE mg/dL
Hgb urine dipstick: NEGATIVE
Ketones, ur: NEGATIVE mg/dL
Leukocytes,Ua: NEGATIVE
Nitrite: NEGATIVE
Protein, ur: NEGATIVE mg/dL
Specific Gravity, Urine: 1.015 (ref 1.005–1.030)
pH: 7 (ref 5.0–8.0)

## 2018-12-08 LAB — I-STAT BETA HCG BLOOD, ED (MC, WL, AP ONLY): I-stat hCG, quantitative: <5 m[IU]/mL (ref <5)

## 2018-12-08 MED ORDER — SODIUM CHLORIDE 0.9% FLUSH: 3.0000 mL | Freq: Once | INTRAVENOUS | Status: DC

## 2018-12-08 NOTE — ED Triage Notes (Signed)
Pt to ED with c/o upper abd pain. St's gets worse after eating.  Pt denies nausea or vomiting but has had diarrhea x's 2 days

## 2018-12-08 NOTE — ED Notes (Signed)
Pt triaged using [employer] Interpreter

## 2018-12-09 LAB — COMPREHENSIVE METABOLIC PANEL
ALT: 15 U/L (ref 0–44)
AST: 14 U/L — ABNORMAL LOW (ref 15–41)
Albumin: 4.1 g/dL (ref 3.5–5.0)
Alkaline Phosphatase: 64 U/L (ref 38–126)
Anion gap: 11 (ref 5–15)
BUN: 12 mg/dL (ref 6–20)
CO2: 25 mmol/L (ref 22–32)
Calcium: 9.6 mg/dL (ref 8.9–10.3)
Chloride: 103 mmol/L (ref 98–111)
Creatinine, Ser: 0.83 mg/dL (ref 0.44–1.00)
GFR calc Af Amer: >60 mL/min (ref >60)
GFR calc non Af Amer: >60 mL/min (ref >60)
Glucose, Bld: 91 mg/dL (ref 70–99)
Potassium: 3.8 mmol/L (ref 3.5–5.1)
Sodium: 139 mmol/L (ref 135–145)
Total Bilirubin: 0.3 mg/dL (ref 0.3–1.2)
Total Protein: 7 g/dL (ref 6.5–8.1)

## 2018-12-09 LAB — LIPASE, BLOOD: Lipase: 22 U/L (ref 11–51)

## 2018-12-09 NOTE — ED Notes (Signed)
Pt states she does not want to wait and if symptoms worsen she will return or see her PCP

## 2018-12-13 ENCOUNTER — Encounter (HOSPITAL_COMMUNITY): Payer: Self-pay

## 2018-12-13 ENCOUNTER — Other Ambulatory Visit: Payer: Self-pay

## 2018-12-13 DIAGNOSIS — K2900 Acute gastritis without bleeding: Secondary | ICD-10-CM | POA: Insufficient documentation

## 2018-12-13 DIAGNOSIS — Z79899 Other long term (current) drug therapy: Secondary | ICD-10-CM | POA: Insufficient documentation

## 2018-12-13 MED ORDER — SODIUM CHLORIDE 0.9% FLUSH: 3.0000 mL | Freq: Once | INTRAVENOUS | Status: DC

## 2018-12-13 NOTE — ED Triage Notes (Signed)
Pt reports epigastric abdominal pain. She states that she was diagnosed with a gastric ulcer about a year ago. Endorses intermittent vomiting and diarrhea. States that eating makes the pain worse.   Video chat InterpreterFreida Busman [phone]

## 2018-12-14 ENCOUNTER — Emergency Department (HOSPITAL_COMMUNITY)
Admission: EM | Admit: 2018-12-14 | Discharge: 2018-12-14 | Disposition: A | Discharge status: Home / Self Care | Payer: Self-pay | Attending: Emergency Medicine | Admitting: Emergency Medicine

## 2018-12-14 DIAGNOSIS — K2900 Acute gastritis without bleeding: Secondary | ICD-10-CM

## 2018-12-14 LAB — URINALYSIS, ROUTINE W REFLEX MICROSCOPIC
Bilirubin Urine: NEGATIVE
Glucose, UA: NEGATIVE mg/dL
Ketones, ur: NEGATIVE mg/dL
Nitrite: NEGATIVE
Protein, ur: NEGATIVE mg/dL
Specific Gravity, Urine: 1.01 (ref 1.005–1.030)
pH: 6 (ref 5.0–8.0)

## 2018-12-14 LAB — COMPREHENSIVE METABOLIC PANEL
ALT: 15 U/L (ref 0–44)
AST: 14 U/L — ABNORMAL LOW (ref 15–41)
Albumin: 4.4 g/dL (ref 3.5–5.0)
Alkaline Phosphatase: 63 U/L (ref 38–126)
Anion gap: 9 (ref 5–15)
BUN: 12 mg/dL (ref 6–20)
CO2: 23 mmol/L (ref 22–32)
Calcium: 9.2 mg/dL (ref 8.9–10.3)
Chloride: 106 mmol/L (ref 98–111)
Creatinine, Ser: 0.48 mg/dL (ref 0.44–1.00)
GFR calc Af Amer: >60 mL/min (ref >60)
GFR calc non Af Amer: >60 mL/min (ref >60)
Glucose, Bld: 103 mg/dL — ABNORMAL HIGH (ref 70–99)
Potassium: 4 mmol/L (ref 3.5–5.1)
Sodium: 138 mmol/L (ref 135–145)
Total Bilirubin: 0.1 mg/dL — ABNORMAL LOW (ref 0.3–1.2)
Total Protein: 7.3 g/dL (ref 6.5–8.1)

## 2018-12-14 LAB — CBC
HCT: 36.9 % (ref 36.0–46.0)
Hemoglobin: 12 g/dL (ref 12.0–15.0)
MCH: 29.6 pg (ref 26.0–34.0)
MCHC: 32.5 g/dL (ref 30.0–36.0)
MCV: 90.9 fL (ref 80.0–100.0)
Platelets: 310 10*3/uL (ref 150–400)
RBC: 4.06 MIL/uL (ref 3.87–5.11)
RDW: 13.6 % (ref 11.5–15.5)
WBC: 10.6 10*3/uL — ABNORMAL HIGH (ref 4.0–10.5)
nRBC: 0 % (ref 0.0–0.2)

## 2018-12-14 LAB — LIPASE, BLOOD: Lipase: 21 U/L (ref 11–51)

## 2018-12-14 LAB — I-STAT BETA HCG BLOOD, ED (MC, WL, AP ONLY): I-stat hCG, quantitative: <5 m[IU]/mL (ref <5)

## 2018-12-14 MED ORDER — FAMOTIDINE 20 MG PO TABS: 20.0000 mg | ORAL_TABLET | Freq: Two times a day (BID) | ORAL | 0 refills | Status: DC

## 2018-12-14 MED ORDER — ALUM & MAG HYDROXIDE-SIMETH 200-200-20 MG/5ML PO SUSP
30.0000 mL | Freq: Once | ORAL | Status: AC
  Administered 2018-12-14: 30 mL via ORAL
  Filled 2018-12-14: qty 30

## 2018-12-14 MED ORDER — LIDOCAINE VISCOUS HCL 2 % MT SOLN
15.0000 mL | Freq: Once | OROMUCOSAL | Status: AC
  Administered 2018-12-14: 04:00:00 15 mL via ORAL
  Filled 2018-12-14: qty 15

## 2018-12-14 MED ORDER — SUCRALFATE 1 GM/10ML PO SUSP: 1.0000 g | Freq: Three times a day (TID) | ORAL | 0 refills | Status: DC

## 2018-12-14 MED ORDER — OMEPRAZOLE 20 MG PO CPDR: 20.0000 mg | DELAYED_RELEASE_CAPSULE | Freq: Every day | ORAL | 0 refills | Status: DC

## 2018-12-14 NOTE — Discharge Instructions (Addendum)
Return to the emergency department if you develop any high fever, severe pain, bloody vomiting or new concern.

## 2018-12-14 NOTE — ED Provider Notes (Signed)
[place] DEPT Provider Note   CSN: [id number] Arrival date & time: 12/13/18  2330     History   Chief Complaint Chief Complaint  Patient presents with  . Abdominal Pain    HPI Denise Avery is a 23 y.o. female.     Patient to ED with recurrent epigastric abdominal pain for the past 6 days. She had nausea with nonbloody emesis 2 days ago and yesterday, but none today. Today she reports diarrhea. No fever. She states eating brings the pain on and it becomes severe at times. No cough, SOB. No RUQ or other abdominal pain. She reports having a diagnosis of ulcers one year ago and was put on medicine that resolved her symptoms but she no longer takes any medicine.   The history is provided by the patient. A language interpreter was used (Via video interpreter).  Abdominal Pain Associated symptoms: diarrhea, nausea and vomiting   Associated symptoms: no chills and no fever     Past Medical History:  Diagnosis Date  . Mastitis   . 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 Exposure to TB 02/10/2014  . History of cesarean delivery affecting pregnancy 02/02/2014    Past Surgical History:  Procedure Laterality Date  . CESAREAN SECTION     emergent at 8 mos.   . CESAREAN SECTION       OB History    Gravida  2   Para  2   Term  1   Preterm  1   AB  0   Living  2     SAB  0   TAB  0   Ectopic  0   Multiple  0   Live Births  2            Home Medications    Prior to Admission medications   Medication Sig Start Date End Date Taking? Authorizing Provider  famotidine (PEPCID) 20 MG tablet Take 1 tablet (20 mg total) by mouth 2 (two) times daily. 12/10/17   Ripley Fraise, MD  omeprazole (PRILOSEC) 20 MG capsule Take 1 capsule (20 mg total) by mouth 2 (two) times daily before a meal. 12/23/17   Montine Circle, PA-C  sucralfate (CARAFATE) 1 g tablet Take 1 tablet (1 g  total) by mouth 4 (four) times daily -  with meals and at bedtime. 12/23/17   Montine Circle, PA-C    Family History Family History  Problem Relation Age of Onset  . Diabetes Maternal Grandmother     Social History Social History   Tobacco Use  . Smoking status: Never Smoker  . Smokeless tobacco: Never Used  Substance Use Topics  . Alcohol use: No  . Drug use: No     Allergies   Patient has no known allergies.   Review of Systems Review of Systems  Constitutional: Negative for chills and fever.  HENT: Negative.   Respiratory: Negative.   Cardiovascular: Negative.   Gastrointestinal: Positive for abdominal pain, diarrhea, nausea and vomiting.  Musculoskeletal: Negative.   Skin: Negative.   Neurological: Negative.      Physical Exam Updated Vital Signs BP (!) 139/91 (BP Location: Right Arm)   Pulse (!) 105   Temp 98.1 F (36.7 C) (Oral)   Resp 20   SpO2 100%   Physical Exam Vitals signs and nursing note reviewed.  Constitutional:      Appearance: She  is well-developed.  Neck:     Musculoskeletal: Normal range of motion.  Pulmonary:     Effort: Pulmonary effort is normal.  Abdominal:     Palpations: Abdomen is soft.     Tenderness: There is abdominal tenderness in the epigastric area. There is no guarding.     Hernia: No hernia is present.     Comments: No tenderness to RUQ abdomen.  Skin:    General: Skin is warm and dry.  Neurological:     Mental Status: She is alert and oriented to person, place, and time.      ED Treatments / Results  Labs (all labs ordered are listed, but only abnormal results are displayed) Labs Reviewed  COMPREHENSIVE METABOLIC PANEL - Abnormal; Notable for the following components:      Result Value   Glucose, Bld 103 (*)    AST 14 (*)    Total Bilirubin 0.1 (*)    All other components within normal limits  CBC - Abnormal; Notable for the following components:   WBC 10.6 (*)    All other components within normal  limits  URINALYSIS, ROUTINE W REFLEX MICROSCOPIC - Abnormal; Notable for the following components:   Color, Urine STRAW (*)    APPearance HAZY (*)    Hgb urine dipstick SMALL (*)    Leukocytes,Ua SMALL (*)    Bacteria, UA RARE (*)    All other components within normal limits  LIPASE, BLOOD  I-STAT BETA HCG BLOOD, ED ([facility], [facility], [facility] ONLY)    EKG None  Radiology No results found.  Procedures Procedures (including critical care time)  Medications Ordered in ED Medications  sodium chloride flush (NS) 0.9 % injection 3 mL (has no administration in time range)  alum & mag hydroxide-simeth (MAALOX/MYLANTA) 200-200-20 MG/5ML suspension 30 mL (has no administration in time range)    And  lidocaine (XYLOCAINE) 2 % viscous mouth solution 15 mL (has no administration in time range)     Initial Impression / Assessment and Plan / ED Course  I have reviewed the triage vital signs and the nursing notes.  Pertinent labs & imaging results that were available during my care of the patient were reviewed by me and considered in my medical decision making (see chart for details).        Patient to ED with epigastric abdominal pain, N, V, D. No fever. No RUQ pain. She reports h/o ulcers.   The patient reports diagnosis of ulcers was made by [facility] one [facility]. She has never had an EGD making definitive diagnosis of ulcers unlikely.   DDx: ulcers vs gastritis vs cholecystitis  Labs are essentially unremarkable. No LFT abnormalities and no leukocytosis to suggest gall bladder disease. She has no RUQ tenderness to exam. Do not feel US abd is necessary tonight.   GI cocktail provided. Will reassess but anticipate restarting sucralfate, Pepcid and Prilosec, and discharge home. Will refer to GI.   Discharge plan discussed via interpreter. All questions answered. Patient is comfortable with discharge home.   Final Clinical Impressions(s) / ED Diagnoses   Final diagnoses:  None    1. Gastritis  ED Discharge Orders    None       Elpidio AnisUpstill, Barret Esquivel, PA-C 12/14/18 [id number]    Ward, Layla MawKristen N, DO 12/14/18 [phone]

## 2019-03-15 ENCOUNTER — Other Ambulatory Visit: Payer: Self-pay

## 2019-03-15 ENCOUNTER — Encounter (HOSPITAL_COMMUNITY): Payer: Self-pay | Admitting: Emergency Medicine

## 2019-03-15 ENCOUNTER — Emergency Department (HOSPITAL_COMMUNITY)
Admission: EM | Admit: 2019-03-15 | Discharge: 2019-03-16 | Disposition: A | Discharge status: Home / Self Care | Payer: Self-pay | Attending: Emergency Medicine | Admitting: Emergency Medicine

## 2019-03-15 DIAGNOSIS — L03011 Cellulitis of right finger: Secondary | ICD-10-CM | POA: Insufficient documentation

## 2019-03-15 DIAGNOSIS — Z79899 Other long term (current) drug therapy: Secondary | ICD-10-CM | POA: Insufficient documentation

## 2019-03-15 DIAGNOSIS — L03113 Cellulitis of right upper limb: Secondary | ICD-10-CM | POA: Insufficient documentation

## 2019-03-15 LAB — BASIC METABOLIC PANEL
Anion gap: 8 (ref 5–15)
BUN: 11 mg/dL (ref 6–20)
CO2: 23 mmol/L (ref 22–32)
Calcium: 9 mg/dL (ref 8.9–10.3)
Chloride: 105 mmol/L (ref 98–111)
Creatinine, Ser: 0.55 mg/dL (ref 0.44–1.00)
GFR calc Af Amer: >60 mL/min (ref >60)
GFR calc non Af Amer: >60 mL/min (ref >60)
Glucose, Bld: 96 mg/dL (ref 70–99)
Potassium: 3.8 mmol/L (ref 3.5–5.1)
Sodium: 136 mmol/L (ref 135–145)

## 2019-03-15 LAB — CBC WITH DIFFERENTIAL/PLATELET
Abs Immature Granulocytes: 0.02 10*3/uL (ref 0.00–0.07)
Basophils Absolute: 0 10*3/uL (ref 0.0–0.1)
Basophils Relative: 0 %
Eosinophils Absolute: 0.2 10*3/uL (ref 0.0–0.5)
Eosinophils Relative: 1 %
HCT: 38.1 % (ref 36.0–46.0)
Hemoglobin: 12.8 g/dL (ref 12.0–15.0)
Immature Granulocytes: 0 %
Lymphocytes Relative: 26 %
Lymphs Abs: 2.8 10*3/uL (ref 0.7–4.0)
MCH: 30 pg (ref 26.0–34.0)
MCHC: 33.6 g/dL (ref 30.0–36.0)
MCV: 89.2 fL (ref 80.0–100.0)
Monocytes Absolute: 0.6 10*3/uL (ref 0.1–1.0)
Monocytes Relative: 5 %
Neutro Abs: 7.4 10*3/uL (ref 1.7–7.7)
Neutrophils Relative %: 68 %
Platelets: 329 10*3/uL (ref 150–400)
RBC: 4.27 MIL/uL (ref 3.87–5.11)
RDW: 13.2 % (ref 11.5–15.5)
WBC: 10.9 10*3/uL — ABNORMAL HIGH (ref 4.0–10.5)
nRBC: 0 % (ref 0.0–0.2)

## 2019-03-15 LAB — I-STAT BETA HCG BLOOD, ED (MC, WL, AP ONLY): I-stat hCG, quantitative: <5 m[IU]/mL (ref <5)

## 2019-03-15 NOTE — ED Triage Notes (Signed)
Patient reports worsening paronychia at right distal 4th finger for 1 week and cellulitis with redness/swelling at right forearm extending to upper arm  onset yesterday . Denies fever or chills .

## 2019-03-16 ENCOUNTER — Emergency Department (HOSPITAL_COMMUNITY): Payer: Self-pay

## 2019-03-16 LAB — D-DIMER, QUANTITATIVE: D-Dimer, Quant: 0.27 ug/mL-FEU (ref 0.00–0.50)

## 2019-03-16 MED ORDER — KETOROLAC TROMETHAMINE 30 MG/ML IJ SOLN
30.0000 mg | Freq: Once | INTRAMUSCULAR | Status: AC
  Administered 2019-03-16: 30 mg via INTRAVENOUS
  Filled 2019-03-16: qty 1

## 2019-03-16 MED ORDER — FENTANYL CITRATE (PF) 100 MCG/2ML IJ SOLN
75.0000 ug | Freq: Once | INTRAMUSCULAR | Status: AC
  Administered 2019-03-16: 75 ug via INTRAVENOUS
  Filled 2019-03-16: qty 2

## 2019-03-16 MED ORDER — CEPHALEXIN 500 MG PO CAPS: 500.0000 mg | ORAL_CAPSULE | Freq: Four times a day (QID) | ORAL | 0 refills | Status: AC

## 2019-03-16 MED ORDER — LIDOCAINE HCL 2 % IJ SOLN
10.0000 mL | Freq: Once | INTRAMUSCULAR | Status: AC
  Administered 2019-03-16: 200 mg
  Filled 2019-03-16: qty 20

## 2019-03-16 NOTE — Discharge Instructions (Addendum)
Gracias por permitirme atenderlo [employer] de Emergencias.  Lo vieron hoy por dolor, enrojecimiento e hinchazn en su anular dedo derecho y le diagnosticaron paroniquia. La paroniquia se dren en Urgencias. Tambin lo examinaron por dolor, enrojecimiento e hinchazn en el brazo derecho y le diagnosticaron celulitis.  Para la paroniquia, debe remojar el dedo en agua tibia durante 15 a 20 minutos un par de veces al [employer] prximos 4 a 5 das. Le he dado [place] nota de trabajo durante 2 [place], [place] cuando regrese al [employer] [place], debe asegurarse de mantener el dedo cubierto hasta que la herida se cierre por completo.  Para la celulitis, tome 1 tableta de Keflex cada 6 horas durante los prximos 5 das. Tome todo el ciclo de antibiticos incluso si sus sntomas mejoran para prevenir la resistencia a los antibiticos.  Tome 650 mg de Tylenol o 600 mg de ibuprofeno con alimentos cada 6 horas para el dolor. Puede alternar entre estos 2 medicamentos cada 3 horas si el dolor regresa. Por ejemplo, puede tomar Tylenol al medioda, seguido de una dosis de ibuprofeno a las 3, seguida de Ardelia Mems segunda dosis de Tylenol y 6.  Si el enrojecimiento, Conservation officer, historic buildings y la hinchazn en su brazo derecho no mejoran significativamente en los prximos 2 das, debe reevaluarlo regresando a la sala de Multimedia programmer o acudiendo a atencin de [employer], ya que no tiene un proveedor de Midwife.  Regrese al departamento de emergencias si presenta un empeoramiento del enrojecimiento, dolor e hinchazn en el brazo derecho a pesar de haber tomado antibiticos durante 2 das, rayas rojas que suben o bajan por el brazo, fiebre alta o escalofros u otros sntomas nuevos relacionados.  Thank you for allowing me to care for you today in the Emergency Department.   You were seen today for pain, redness, and swelling in your right fourth finger and were diagnosed with a paronychia.  The paronychia was drained in the ER. You were  also seen for pain, redness, and swelling in your right arm and were diagnosed with cellulitis.  For the paronychia, you should soak the finger in warm water for 15 to 20 minutes a couple of times a day for the next 4 to 5 days.  I have given you a work note for 2 days, but when you return to work, you should make sure to keep the finger covered until the wound completely closes.  For the cellulitis, take 1 tablet of Keflex every 6 hours in the next 5 days.  Take the entire course of antibiotics even if your symptoms improve to prevent antibiotic resistance.  Take 650 mg of Tylenol or 600 mg of ibuprofen with food every 6 hours for pain.  You can alternate between these 2 medications every 3 hours if your pain returns.  For instance, you can take Tylenol at noon, followed by a dose of ibuprofen at 3, followed by second dose of Tylenol and 6.  If the redness, pain, and swelling in her right arm does not significantly improve in the next 2 days, you need to have it reevaluated by either returning to the ER or going to urgent care since you do not have a primary care provider.  Return the emergency department if you develop worsening redness, pain, and swelling to the right arm despite having taken antibiotics for 2 days, red streaking that goes up or down the arm, high fevers or chills, or other new, concerning symptoms.

## 2019-03-16 NOTE — ED Provider Notes (Signed)
[facility] EMERGENCY DEPARTMENT Provider Note   CSN: [id number] Arrival date & time: 03/15/19  1930     History Chief Complaint  Patient presents with  . Paronychia    Cellulitis    Denise Avery is a 23 y.o. female with no significant medical history who presents to the emergency department with a chief complaint of pain and swelling to the right fourth fingertip for 2 weeks.  She has noticed some white discoloration around the nailbed since onset of symptoms.  No drainage from the area.  No treatment prior to arrival.  She reports that today that she developed redness, pain and swelling to her right mid forearm that extends to the middle of her upper arm.  She notes maximal tenderness in the arm is at her right elbow.  She is having no other arthralgias.  No recent falls or injuries.  No fever or chills.  No numbness or weakness.  No treatment prior to arrival.  No concerns for pregnancy at this time.  No treatment prior to arrival.  She does not have a PCP.  Denies any recent injuries to the finger.  She does not bite her fingernails.  Reports that she did have a splinter in the right fourth finger about a year ago.  No other known trauma or injury.  The history is provided by the patient. No language interpreter was used.       Past Medical History:  Diagnosis Date  . Mastitis   . 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 Exposure to TB 02/10/2014  . History of cesarean delivery affecting pregnancy 02/02/2014    Past Surgical History:  Procedure Laterality Date  . CESAREAN SECTION     emergent at 8 mos.   . CESAREAN SECTION       OB History    Gravida  2   Para  2   Term  1   Preterm  1   AB  0   Living  2     SAB  0   TAB  0   Ectopic  0   Multiple  0   Live Births  2           Family History  Problem Relation Age of Onset  . Diabetes Maternal Grandmother      Social History   Tobacco Use  . Smoking status: Never Smoker  . Smokeless tobacco: Never Used  Substance Use Topics  . Alcohol use: No  . Drug use: No    Home Medications Prior to Admission medications   Medication Sig Start Date End Date Taking? Authorizing Provider  cephALEXin (KEFLEX) 500 MG capsule Take 1 capsule (500 mg total) by mouth 4 (four) times daily for 5 days. 03/16/19 03/21/19  Sausha Raymond A, PA-C  famotidine (PEPCID) 20 MG tablet Take 1 tablet (20 mg total) by mouth 2 (two) times daily. 12/14/18   Charlann Lange, PA-C  omeprazole (PRILOSEC) 20 MG capsule Take 1 capsule (20 mg total) by mouth daily. 12/14/18   Charlann Lange, PA-C  sucralfate (CARAFATE) 1 GM/10ML suspension Take 10 mLs (1 g total) by mouth 4 (four) times daily -  with meals and at bedtime. 12/14/18   Charlann Lange, PA-C    Allergies    Patient has no known allergies.  Review of Systems   Review of Systems  Constitutional: Negative for activity change, chills  and fever.  Respiratory: Negative for shortness of breath.   Cardiovascular: Negative for chest pain.  Gastrointestinal: Negative for abdominal pain, diarrhea, nausea and vomiting.  Genitourinary: Negative for dysuria.  Musculoskeletal: Positive for arthralgias, joint swelling and myalgias. Negative for back pain, gait problem, neck pain and neck stiffness.  Skin: Positive for color change and wound. Negative for rash.  Allergic/Immunologic: Negative for immunocompromised state.  Neurological: Negative for weakness, numbness and headaches.  Psychiatric/Behavioral: Negative for confusion.    Physical Exam Updated Vital Signs BP 105/69   Pulse 86   Temp 97.9 F (36.6 C) (Oral)   Resp 14   SpO2 99%   Physical Exam Vitals and nursing note reviewed.  Constitutional:      General: She is not in acute distress. HENT:     Head: Normocephalic.  Eyes:     Conjunctiva/sclera: Conjunctivae normal.  Cardiovascular:     Rate and Rhythm:  Normal rate and regular rhythm.     Heart sounds: No murmur. No friction rub. No gallop.   Pulmonary:     Effort: Pulmonary effort is normal. No respiratory distress.  Abdominal:     General: There is no distension.     Palpations: Abdomen is soft.  Musculoskeletal:     Cervical back: Neck supple.     Comments: Paronychia is noted to the right fourth finger with redness and swelling noted to the distal tip of the digit.  Purulence is noted around the nailbed.  No active drainage or drainage able to be expressed.  She has no tenderness to the proximal phalanx and there is no redness or swelling.  There is no redness, swelling, or red streaking noted to the hand.  She has full active and passive range of motion of the right wrist without pain.  Approximately 3 cm proximal to the right wrist, there is circumferential swelling and pain that extends to the mid upper arm.  However, muscular compartments are soft.  There is erythema noted on the 2/3 of the upper arm including the medial aspect of the arm.  The lateral aspect is spared.  She has full active and passive range of motion of the right elbow, but it is painful.  No significant effusion noted to the right elbow.  Normal exam of the right shoulder.  No red streaking is noted proximally or distally from the area of pain, redness, and swelling to the right arm.  Sensation is intact and equal throughout.  Radial pulses are 2+ and symmetric.  Skin:    General: Skin is warm.     Findings: No rash.  Neurological:     Mental Status: She is alert.  Psychiatric:        Behavior: Behavior normal.          ED Results / Procedures / Treatments   Labs (all labs ordered are listed, but only abnormal results are displayed) Labs Reviewed  CBC WITH DIFFERENTIAL/PLATELET - Abnormal; Notable for the following components:      Result Value   WBC 10.9 (*)    All other components within normal limits  BASIC METABOLIC PANEL  D-DIMER, QUANTITATIVE (NOT  AT [facility])  I-STAT BETA HCG BLOOD, ED ([facility], [facility], [facility] ONLY)    EKG None  Radiology DG Elbow Complete Right  Result Date: 03/16/2019 CLINICAL DATA:  Right elbow pain EXAM: RIGHT ELBOW - COMPLETE 3+ VIEW COMPARISON:  None. FINDINGS: There is no evidence of fracture, dislocation, or joint effusion. There is no evidence  of arthropathy or other focal bone abnormality. Subcutaneous edema seen over the anterior and medial aspect of the elbow. IMPRESSION: No acute osseous abnormality. Electronically Signed   By: Jonna Clark M.D.   On: 03/16/2019 02:34    Procedures Drain paronychia  Date/Time: 03/16/2019 4:58 AM Performed by: Barkley Boards, PA-C Authorized by: Barkley Boards, PA-C  Consent: Verbal consent obtained. Consent given by: patient Patient understanding: patient states understanding of the procedure being performed Patient consent: the patient's understanding of the procedure matches consent given Local anesthesia used: yes Anesthesia: digital block  Anesthesia: Local anesthesia used: yes Local Anesthetic: lidocaine 2% without epinephrine Anesthetic total: 10 mL  Sedation: Patient sedated: no  Patient tolerance: patient tolerated the procedure well with no immediate complications Comments: Risks and benefits of the procedure were discussed with the patient. full active and passive range of motion of the digit was intact following the procedure.    (including critical care time)  Medications Ordered in ED Medications  fentaNYL (SUBLIMAZE) injection 75 mcg (75 mcg Intravenous Given 03/16/19 0247)  lidocaine (XYLOCAINE) 2 % (with pres) injection 200 mg (200 mg Infiltration Given by Other 03/16/19 0439)  ketorolac (TORADOL) 30 MG/ML injection 30 mg (30 mg Intravenous Given 03/16/19 0422)    ED Course  I have reviewed the triage vital signs and the nursing notes.  Pertinent labs & imaging results that were available during my care of the patient were reviewed by me and  considered in my medical decision making (see chart for details).    MDM Rules/Calculators/A&P                      23 year old female with no significant past medical history presenting with a paronychia to the right fourth finger that began 2 weeks ago.  Over the last 24 hours, she developed isolated pain, redness, and swelling to the mid right forearm that extends to the mid right upper arm.  The pain in the right arm appears to be isolated as she is having no pain, redness, or swelling noted to the right hand or to the proximal portion of the right fourth finger.  No tenderness over the flexor or extensor compartments of the right fourth finger.  The patient was discussed with Dr. Preston Fleeting, attending physician.  Differential diagnosis includes pyogenic tenosynovitis, upper extremity DVT, cellulitis, or septic joint.    Less likely pyogenic tenosynovitis as the hand and proximal portion of the right fourth finger is spared as mentioned above.  She is not having any constitutional symptoms.  There is no red streaking noted throughout.  No significant leukocytosis noted on labs.  Upper extremity DVT would also be unusual as she has no other risk factors.  She has an implant for birth control and has no family history or personal history of VTE.  Given low risk nature, a D-dimer was obtained and was normal.  Given maximal tenderness over the elbow, septic joint was considered.  She has having no other arthralgias.  X-ray of the right elbow was obtained and was unremarkable.  She is also not having any constitutional symptoms and has full active and passive range of motion of the elbow.   We will treat for cellulitis of the right arm with Keflex.  Wound recheck was recommended in 48hours either urgent care or the ER since the patient is not established with primary care.  Warm soaks were recommended for paronychia.  She was given instructions on home care.  All information was given via a [place] medical  interpreter.  She is hemodynamically stable and in no acute distress.  All questions answered.  Safe for discharge home with outpatient follow-up as indicated.  Final Clinical Impression(s) / ED Diagnoses Final diagnoses:  Paronychia of right ring finger  Right arm cellulitis    Rx / DC Orders ED Discharge Orders         Ordered    cephALEXin (KEFLEX) 500 MG capsule  4 times daily     03/16/19 0436           Barkley BoardsMcDonald, Charnae Lill A, PA-C 03/16/19 0523    Dione BoozeGlick, David, MD 03/16/19 [phone]

## 2021-04-15 ENCOUNTER — Emergency Department (HOSPITAL_COMMUNITY)
Admission: EM | Admit: 2021-04-15 | Discharge: 2021-04-16 | Disposition: A | Discharge status: Home / Self Care | Payer: Self-pay | Attending: Emergency Medicine | Admitting: Emergency Medicine

## 2021-04-15 ENCOUNTER — Encounter (HOSPITAL_COMMUNITY): Payer: Self-pay

## 2021-04-15 DIAGNOSIS — N898 Other specified noninflammatory disorders of vagina: Secondary | ICD-10-CM | POA: Insufficient documentation

## 2021-04-15 DIAGNOSIS — M545 Low back pain, unspecified: Secondary | ICD-10-CM | POA: Insufficient documentation

## 2021-04-15 DIAGNOSIS — R102 Pelvic and perineal pain: Secondary | ICD-10-CM | POA: Insufficient documentation

## 2021-04-15 DIAGNOSIS — R300 Dysuria: Secondary | ICD-10-CM | POA: Insufficient documentation

## 2021-04-15 DIAGNOSIS — N9489 Other specified conditions associated with female genital organs and menstrual cycle: Secondary | ICD-10-CM | POA: Insufficient documentation

## 2021-04-15 DIAGNOSIS — R112 Nausea with vomiting, unspecified: Secondary | ICD-10-CM | POA: Insufficient documentation

## 2021-04-15 DIAGNOSIS — A568 Sexually transmitted chlamydial infection of other sites: Secondary | ICD-10-CM | POA: Insufficient documentation

## 2021-04-15 DIAGNOSIS — A64 Unspecified sexually transmitted disease: Secondary | ICD-10-CM

## 2021-04-15 DIAGNOSIS — R109 Unspecified abdominal pain: Secondary | ICD-10-CM

## 2021-04-15 DIAGNOSIS — N644 Mastodynia: Secondary | ICD-10-CM | POA: Insufficient documentation

## 2021-04-15 LAB — URINALYSIS, ROUTINE W REFLEX MICROSCOPIC
Bilirubin Urine: NEGATIVE
Glucose, UA: NEGATIVE mg/dL
Hgb urine dipstick: NEGATIVE
Ketones, ur: NEGATIVE mg/dL
Leukocytes,Ua: NEGATIVE
Nitrite: NEGATIVE
Protein, ur: NEGATIVE mg/dL
Specific Gravity, Urine: 1.017 (ref 1.005–1.030)
pH: 5 (ref 5.0–8.0)

## 2021-04-15 LAB — CBC WITH DIFFERENTIAL/PLATELET
Abs Immature Granulocytes: 0.04 10*3/uL (ref 0.00–0.07)
Basophils Absolute: 0 10*3/uL (ref 0.0–0.1)
Basophils Relative: 0 %
Eosinophils Absolute: 0.2 10*3/uL (ref 0.0–0.5)
Eosinophils Relative: 1 %
HCT: 37.3 % (ref 36.0–46.0)
Hemoglobin: 12.9 g/dL (ref 12.0–15.0)
Immature Granulocytes: 0 %
Lymphocytes Relative: 27 %
Lymphs Abs: 3.6 10*3/uL (ref 0.7–4.0)
MCH: 30.6 pg (ref 26.0–34.0)
MCHC: 34.6 g/dL (ref 30.0–36.0)
MCV: 88.4 fL (ref 80.0–100.0)
Monocytes Absolute: 0.8 10*3/uL (ref 0.1–1.0)
Monocytes Relative: 6 %
Neutro Abs: 8.6 10*3/uL — ABNORMAL HIGH (ref 1.7–7.7)
Neutrophils Relative %: 66 %
Platelets: 332 10*3/uL (ref 150–400)
RBC: 4.22 MIL/uL (ref 3.87–5.11)
RDW: 13.7 % (ref 11.5–15.5)
WBC: 13.2 10*3/uL — ABNORMAL HIGH (ref 4.0–10.5)
nRBC: 0 % (ref 0.0–0.2)

## 2021-04-15 LAB — COMPREHENSIVE METABOLIC PANEL
ALT: 28 U/L (ref 0–44)
AST: 19 U/L (ref 15–41)
Albumin: 3.6 g/dL (ref 3.5–5.0)
Alkaline Phosphatase: 66 U/L (ref 38–126)
Anion gap: 11 (ref 5–15)
BUN: 9 mg/dL (ref 6–20)
CO2: 22 mmol/L (ref 22–32)
Calcium: 8.9 mg/dL (ref 8.9–10.3)
Chloride: 105 mmol/L (ref 98–111)
Creatinine, Ser: 0.53 mg/dL (ref 0.44–1.00)
GFR, Estimated: >60 mL/min (ref >60)
Glucose, Bld: 114 mg/dL — ABNORMAL HIGH (ref 70–99)
Potassium: 3.7 mmol/L (ref 3.5–5.1)
Sodium: 138 mmol/L (ref 135–145)
Total Bilirubin: 0.3 mg/dL (ref 0.3–1.2)
Total Protein: 6.6 g/dL (ref 6.5–8.1)

## 2021-04-15 LAB — LIPASE, BLOOD: Lipase: 25 U/L (ref 11–51)

## 2021-04-15 LAB — I-STAT BETA HCG BLOOD, ED (MC, WL, AP ONLY): I-stat hCG, quantitative: <5 m[IU]/mL (ref <5)

## 2021-04-15 NOTE — ED Triage Notes (Signed)
Pt arrives POV for eval of N/V after eating, breast pain, and blt LQ abd pain. Pt reports her abd feels hard to her. Pt states she also has pain in her back which makes it hard to breathe for her. No fevers/chills, denies CP

## 2021-04-15 NOTE — ED Provider Triage Note (Signed)
Emergency Medicine Provider Triage Evaluation Note  Denise Avery , a 26 y.o. female  was evaluated in triage.  Pt complains of vomiting with eating onset 3 days. Denies sick contacts. Has associated breast pain, back pain, generalized abdominal pain, shortness of breath with exertion, dysuria x 3 days, urinary frequency. Has not tried any medications. Denies fever, chills, hematuria. Denies history of asthma or copd. Denies history of UTI. Pt was told that she had a peptic ulcer approximately 6 months ago.  Review of Systems  Positive: As per HPI above.  Negative: Fever, chills  Physical Exam  BP 127/71 (BP Location: Right Arm)    Pulse 99    Temp (!) 97.3 F (36.3 C)    Resp 16    Ht 5\' 1"  (1.549 m)    Wt 73 kg    SpO2 100%    BMI 30.41 kg/m  Gen:   Awake, no distress   Resp:  Normal effort  MSK:   Moves extremities without difficulty  Other:  Epigastric tenderness to palpation. No CVA TTP bilaterally.   Medical Decision Making  Medically screening exam initiated at 9:48 PM.  Appropriate orders placed.  Denise Avery was informed that the remainder of the evaluation will be completed by another provider, this initial triage assessment does not replace that evaluation, and the importance of remaining in the ED until their evaluation is complete.   Steve Youngberg A, PA-C 04/15/21 2156

## 2021-04-16 ENCOUNTER — Other Ambulatory Visit: Payer: Self-pay

## 2021-04-16 ENCOUNTER — Emergency Department (HOSPITAL_COMMUNITY): Payer: Self-pay

## 2021-04-16 LAB — WET PREP, GENITAL
Clue Cells Wet Prep HPF POC: NONE SEEN
Sperm: NONE SEEN
Trich, Wet Prep: NONE SEEN
WBC, Wet Prep HPF POC: >=10 — AB (ref <10)
Yeast Wet Prep HPF POC: NONE SEEN

## 2021-04-16 LAB — RPR: RPR Ser Ql: NONREACTIVE

## 2021-04-16 MED ORDER — MORPHINE SULFATE (PF) 4 MG/ML IV SOLN
4.0000 mg | Freq: Once | INTRAVENOUS | Status: AC
  Administered 2021-04-16: 4 mg via INTRAVENOUS
  Filled 2021-04-16: qty 1

## 2021-04-16 MED ORDER — LIDOCAINE HCL (PF) 1 % IJ SOLN
INTRAMUSCULAR | Status: AC
  Filled 2021-04-16: qty 5

## 2021-04-16 MED ORDER — DOXYCYCLINE HYCLATE 100 MG PO TABS
100.0000 mg | ORAL_TABLET | Freq: Once | ORAL | Status: AC
  Administered 2021-04-16: 100 mg via ORAL
  Filled 2021-04-16: qty 1

## 2021-04-16 MED ORDER — DOXYCYCLINE HYCLATE 100 MG PO CAPS: 100.0000 mg | ORAL_CAPSULE | Freq: Two times a day (BID) | ORAL | 0 refills | Status: AC

## 2021-04-16 MED ORDER — ONDANSETRON 4 MG PO TBDP: 4.0000 mg | ORAL_TABLET | Freq: Three times a day (TID) | ORAL | 0 refills | Status: DC | PRN

## 2021-04-16 MED ORDER — ONDANSETRON HCL 4 MG/2ML IJ SOLN
4.0000 mg | Freq: Once | INTRAMUSCULAR | Status: AC
  Administered 2021-04-16: 4 mg via INTRAVENOUS
  Filled 2021-04-16: qty 2

## 2021-04-16 MED ORDER — CEFTRIAXONE SODIUM 500 MG IJ SOLR
500.0000 mg | Freq: Once | INTRAMUSCULAR | Status: AC
  Administered 2021-04-16: 500 mg via INTRAMUSCULAR
  Filled 2021-04-16: qty 500

## 2021-04-16 MED ORDER — IOHEXOL 300 MG/ML  SOLN
100.0000 mL | Freq: Once | INTRAMUSCULAR | Status: AC | PRN
  Administered 2021-04-16: 100 mL via INTRAVENOUS

## 2021-04-16 NOTE — ED Provider Notes (Signed)
[facility] EMERGENCY DEPARTMENT Provider Note   CSN: [id number] Arrival date & time: 04/15/21  2051     History  Chief Complaint  Patient presents with   Vomiting   Back Pain    Denise Avery is a 26 y.o. female.  26 year old female presents today for evaluation of bilateral breast pain, abdominal pain, pelvic pain, and back pain, dysuria of 3-day duration.  She denies fever, chills.  She reports nausea and vomiting with each meal over the same duration.  She reports yellow vaginal discharge since last night.  She describes her vomit as nonbilious and nonbloody.  Denies prior history of similar symptoms prior to 3 days ago.  Denies recent illness.  States she presents today to the emergency room because yesterday her symptoms worsened in terms of pain.  She is sexually active with 1 partner and does not use protection.  The history is provided by the patient. No language interpreter was used.      Home Medications Prior to Admission medications   Medication Sig Start Date End Date Taking? Authorizing Provider  famotidine (PEPCID) 20 MG tablet Take 1 tablet (20 mg total) by mouth 2 (two) times daily. 12/14/18   Elpidio Anis, PA-C  omeprazole (PRILOSEC) 20 MG capsule Take 1 capsule (20 mg total) by mouth daily. 12/14/18   Elpidio Anis, PA-C  sucralfate (CARAFATE) 1 GM/10ML suspension Take 10 mLs (1 g total) by mouth 4 (four) times daily -  with meals and at bedtime. 12/14/18   Elpidio Anis, PA-C      Allergies    Patient has no known allergies.    Review of Systems   Review of Systems  Constitutional:  Negative for activity change, chills and fever.  Respiratory:  Negative for shortness of breath.   Cardiovascular:  Negative for chest pain.  Gastrointestinal:  Positive for abdominal pain, nausea and vomiting.  Genitourinary:  Positive for dysuria, pelvic pain and vaginal discharge. Negative for vaginal bleeding.  Musculoskeletal:  Positive for back  pain. Negative for gait problem.  Neurological:  Negative for weakness and light-headedness.  All other systems reviewed and are negative.  Physical Exam Updated Vital Signs BP 118/71 (BP Location: Right Arm)    Pulse 80    Temp 98.1 F (36.7 C) (Oral)    Resp 12    Ht 5\' 1"  (1.549 m)    Wt 73 kg    SpO2 100%    BMI 30.41 kg/m  Physical Exam Vitals and nursing note reviewed. Exam conducted with a chaperone present.  Constitutional:      General: She is not in acute distress.    Appearance: Normal appearance. She is not ill-appearing.  HENT:     Head: Normocephalic and atraumatic.     Nose: Nose normal.  Eyes:     General: No scleral icterus.    Extraocular Movements: Extraocular movements intact.     Conjunctiva/sclera: Conjunctivae normal.  Cardiovascular:     Rate and Rhythm: Normal rate and regular rhythm.     Pulses: Normal pulses.  Pulmonary:     Effort: Pulmonary effort is normal. No respiratory distress.     Breath sounds: Normal breath sounds. No wheezing or rales.  Chest:  Breasts:    Right: Tenderness present.     Left: Tenderness present.     Comments: Nodularity present over bilateral breast superior and midline to nipples.  Abdominal:     General: There is no distension.     Tenderness:  There is abdominal tenderness. There is right CVA tenderness and left CVA tenderness. There is no guarding.     Hernia: There is no hernia in the left inguinal area or right inguinal area.  Genitourinary:    General: Normal vulva.     Exam position: Lithotomy position.     Labia:        Right: No lesion.        Left: No lesion.      Vagina: Normal. No signs of injury. No vaginal discharge, bleeding or lesions.     Cervix: Cervical motion tenderness and discharge present. No lesion or cervical bleeding.     Adnexa:        Right: No mass, tenderness or fullness.         Left: No mass, tenderness or fullness.    Musculoskeletal:        General: Normal range of motion.      Cervical back: Normal range of motion.     Right lower leg: No edema.     Left lower leg: No edema.  Skin:    General: Skin is warm and dry.  Neurological:     General: No focal deficit present.     Mental Status: She is alert. Mental status is at baseline.    ED Results / Procedures / Treatments   Labs (all labs ordered are listed, but only abnormal results are displayed) Labs Reviewed  CBC WITH DIFFERENTIAL/PLATELET - Abnormal; Notable for the following components:      Result Value   WBC 13.2 (*)    Neutro Abs 8.6 (*)    All other components within normal limits  COMPREHENSIVE METABOLIC PANEL - Abnormal; Notable for the following components:   Glucose, Bld 114 (*)    All other components within normal limits  WET PREP, GENITAL  LIPASE, BLOOD  URINALYSIS, ROUTINE W REFLEX MICROSCOPIC  RPR  I-STAT BETA HCG BLOOD, ED ([facility], [facility], [facility] ONLY)  GC/CHLAMYDIA PROBE AMP ([facility]) NOT [facility]    EKG None  Radiology No results found.  Procedures Procedures    Medications Ordered in ED Medications  morphine 4 MG/ML injection 4 mg (has no administration in time range)  ondansetron (ZOFRAN) injection 4 mg (has no administration in time range)    ED Course/ Medical Decision Making/ A&P                           Medical Decision Making Amount and/or Complexity of Data Reviewed Labs: ordered. Radiology: ordered.  Risk Prescription drug management.   Medical Decision Making / ED Course   This patient presents to the ED for concern of abdominal pain, pelvic pain, nausea vomiting of 3-day duration, this involves an extensive number of treatment options, and is a complaint that carries with it a high risk of complications and morbidity.  The differential diagnosis includes STI, acute intra abdominal process, UTI, mastitis,  MDM: 26 year old female presents today for evaluation of above symptoms of 3-day duration with worsening of her symptoms yesterday along with  developing yellow vaginal discharge.  CT abdomen pelvis without acute intra-abdominal process.  CT scan does demonstrate hemangioma in liver.  Mild leukocytosis with left shift, CBC without anemia, CMP without transaminitis, or renal insufficiency.  Lipase within normal limits.  UA without UTI.  Pelvic exam done with chaperone demonstrated small amounts of cervical discharge otherwise without significant findings.  Wet prep with white blood cells otherwise negative.  GC and Chlamydia collected.  Patient has established with gynecologist and will follow from the standpoint of mastalgia.  Bilateral breast without erythema, warmth, swelling.  Bilateral breast do have a nodular feeling on palpation as discussed in physical exam however no peau d'orange appearance.  Patient will follow-up with her gynecologist regarding this.  Discussed at length importance of establishing primary care provider for further work-up and management of hemangioma.  Resource provided.    Lab Tests: -I ordered, reviewed, and interpreted labs.   The pertinent results include:   Labs Reviewed  WET PREP, GENITAL - Abnormal; Notable for the following components:      Result Value   WBC, Wet Prep HPF POC >=10 (*)    All other components within normal limits  CBC WITH DIFFERENTIAL/PLATELET - Abnormal; Notable for the following components:   WBC 13.2 (*)    Neutro Abs 8.6 (*)    All other components within normal limits  COMPREHENSIVE METABOLIC PANEL - Abnormal; Notable for the following components:   Glucose, Bld 114 (*)    All other components within normal limits  LIPASE, BLOOD  URINALYSIS, ROUTINE W REFLEX MICROSCOPIC  RPR  I-STAT BETA HCG BLOOD, ED ([facility], [facility], [facility] ONLY)  GC/CHLAMYDIA PROBE AMP ([place]) NOT AT [facility]      EKG  EKG Interpretation  Date/Time:    Ventricular Rate:    PR Interval:    QRS Duration:   QT Interval:    QTC Calculation:   R Axis:     Text Interpretation:           Imaging Studies  ordered: I ordered imaging studies including CT abdomen pelvis with contrast I independently visualized and interpreted imaging. I agree with the radiologist interpretation   Medicines ordered and prescription drug management: Meds ordered this encounter  Medications   morphine 4 MG/ML injection 4 mg   ondansetron (ZOFRAN) injection 4 mg    -I have reviewed the patients home medicines and have made adjustments as needed  Consultations Obtained: N/A  Reevaluation: After the interventions noted above, I reevaluated the patient and found that they have :improved Patient reported improvement in pain following morphine and Zofran.  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  Patient discharged in stable condition.  Return precautions discussed with patient at length.  Patient voices understanding and is in agreement with plan.   Final Clinical Impression(s) / ED Diagnoses Final diagnoses:  Mastalgia  Vaginal discharge  STI (sexually transmitted infection)  Abdominal pain, unspecified abdominal location    Rx / DC Orders ED Discharge Orders          Ordered    doxycycline (VIBRAMYCIN) 100 MG capsule  2 times daily        04/16/21 1003    ondansetron (ZOFRAN-ODT) 4 MG disintegrating tablet  Every 8 hours PRN        04/16/21 1003              Marita [place], PA-C 04/16/21 1008    Gloris Manchester, MD 04/16/21 2202

## 2021-04-16 NOTE — Discharge Instructions (Addendum)
Your work-up today showed slight elevation in your infection/WBC count otherwise your blood work was reassuring.  Your CT scan did not show anything concerning.  However as we discussed show a hemangioma in your liver.  I recommend you establish care with a primary care provider and have this followed up with.  I have provided you with a list of clinics in the community you could establish care with.  Your pelvic exam did show discharge as well as some white blood cell count on the wet prep.  The results for gonorrhea and chlamydia will take 2-3 days after the discussion we had you decided you wanted to start treatment today.  For the breast pain that you are having I recommend you follow-up with your gynecologist.  You did mention you have a gynecologist you are established with already on [address].  I have sent in antibiotics to your pharmacy as well as Zofran for nausea.  Have received your first dose of antibiotic in the emergency room and can begin taking these antibiotic starting tonight.  If you have any worsening of your symptoms such as worsening abdominal pain, nausea vomiting to the point you are unable to keep any food or drink down despite taking Zofran, worsening pelvic pain, or fever please return to the emergency room.

## 2021-04-17 LAB — GC/CHLAMYDIA PROBE AMP (~~LOC~~) NOT AT ARMC
Chlamydia: NEGATIVE
Comment: NEGATIVE
Comment: NORMAL
Neisseria Gonorrhea: NEGATIVE

## 2021-05-17 ENCOUNTER — Encounter (HOSPITAL_COMMUNITY): Payer: Self-pay | Admitting: Radiology

## 2021-05-21 ENCOUNTER — Encounter (INDEPENDENT_AMBULATORY_CARE_PROVIDER_SITE_OTHER): Payer: Self-pay

## 2021-05-21 ENCOUNTER — Ambulatory Visit (INDEPENDENT_AMBULATORY_CARE_PROVIDER_SITE_OTHER): Payer: Self-pay | Admitting: Physician Assistant

## 2021-05-21 ENCOUNTER — Encounter: Payer: Self-pay | Admitting: Physician Assistant

## 2021-05-21 ENCOUNTER — Other Ambulatory Visit: Payer: Self-pay

## 2021-05-21 ENCOUNTER — Encounter: Payer: Self-pay | Admitting: Internal Medicine

## 2021-05-21 VITALS — BP 115/74 | HR 78 | Temp 98.2°F | Resp 18 | Ht 61.0 in | Wt 169.0 lb

## 2021-05-21 DIAGNOSIS — D72829 Elevated white blood cell count, unspecified: Secondary | ICD-10-CM

## 2021-05-21 DIAGNOSIS — K769 Liver disease, unspecified: Secondary | ICD-10-CM

## 2021-05-21 DIAGNOSIS — Z1159 Encounter for screening for other viral diseases: Secondary | ICD-10-CM

## 2021-05-21 DIAGNOSIS — E6609 Other obesity due to excess calories: Secondary | ICD-10-CM

## 2021-05-21 DIAGNOSIS — N644 Mastodynia: Secondary | ICD-10-CM

## 2021-05-21 DIAGNOSIS — Z6831 Body mass index (BMI) 31.0-31.9, adult: Secondary | ICD-10-CM

## 2021-05-21 NOTE — Progress Notes (Signed)
Patient has eaten today. ?Patient has not taken medication today. ?Patient denies pain at this time. ?

## 2021-05-21 NOTE — Progress Notes (Signed)
New Patient Office Visit  Subjective:  Patient ID: Denise Avery, female    DOB: 1995-06-15  Age: 26 y.o. MRN: [id number]  CC:  Chief Complaint  Patient presents with   Hospitalization Follow-up    STI    HPI Denise Avery reports that she was seen in the emergency department on April 15, 2021.  Hospital course:     Medical Decision Making / ED Course     MDM: 26 year old female presents today for evaluation of above symptoms of 3-day duration with worsening of her symptoms yesterday along with developing yellow vaginal discharge.  CT abdomen pelvis without acute intra-abdominal process.  CT scan does demonstrate hemangioma in liver.  Mild leukocytosis with left shift, CBC without anemia, CMP without transaminitis, or renal insufficiency.  Lipase within normal limits.  UA without UTI.  Pelvic exam done with chaperone demonstrated small amounts of cervical discharge otherwise without significant findings.  Wet prep with white blood cells otherwise negative.  GC and Chlamydia collected.  Patient has established with gynecologist and will follow from the standpoint of mastalgia.  Bilateral breast without erythema, warmth, swelling.  Bilateral breast do have a nodular feeling on palpation as discussed in physical exam however no peau d'orange appearance.  Patient will follow-up with her gynecologist regarding this.  Discussed at length importance of establishing primary care provider for further work-up and management of hemangioma.  Resource provided.      Lab Tests: -I ordered, reviewed, and interpreted labs.   The pertinent results include:        Labs Reviewed  WET PREP, GENITAL - Abnormal; Notable for the following components:      Result Value     WBC, Wet Prep HPF POC >=10 (*)      All other components within normal limits  CBC WITH DIFFERENTIAL/PLATELET - Abnormal; Notable for the following components:    WBC 13.2 (*)      Neutro Abs 8.6 (*)      All other  components within normal limits  COMPREHENSIVE METABOLIC PANEL - Abnormal; Notable for the following components:    Glucose, Bld 114 (*)      All other components within normal limits  LIPASE, BLOOD  URINALYSIS, ROUTINE W REFLEX MICROSCOPIC  RPR  I-STAT BETA HCG BLOOD, ED ([facility], [facility], [facility] ONLY)  GC/CHLAMYDIA PROBE AMP ([place]) NOT AT [facility]        EKG   EKG Interpretation   Date/Time:                        Ventricular Rate:     PR Interval:                        QRS Duration:          QT Interval:                        QTC Calculation:      R Axis:                           Text Interpretation:                            Imaging Studies ordered: I ordered imaging studies including CT abdomen pelvis with contrast I independently visualized and interpreted imaging. I agree with the radiologist  interpretation     Medicines ordered and prescription drug management:    Meds ordered this encounter  Medications   morphine 4 MG/ML injection 4 mg   ondansetron (ZOFRAN) injection 4 mg    -I have reviewed the patients home medicines and have made adjustments as needed   Consultations Obtained: N/A   Reevaluation: After the interventions noted above, I reevaluated the patient and found that they have :improved Patient reported improvement in pain following morphine and Zofran.   Co morbidities that complicate the patient evaluation      Past Medical History:  Diagnosis Date   Mastitis     Positive TB test      CXR- Neg      Dispostion: Patient is stable for discharge.  Patient discharged in stable condition.  Return precautions discussed with patient at length.  Patient voices understanding and is in agreement with plan.    States today that she did finish the antibiotic and her abdominal symptoms that she reported at the emergency department have resolved.  Reports that she goes to the [facility] department for her injections.  Does endorse that  she continues to have pain in both breasts, states that her pain started approximately 1 month ago.  Has not tried anything for relief. States that the emergency department told her she had nodules on both breasts.  Due to language barrier, an interpreter was present during the history-taking and subsequent discussion (and for part of the physical exam) with this patient.     Past Medical History:  Diagnosis Date   Mastitis    Positive TB test    CXR- Neg    Past Surgical History:  Procedure Laterality Date   CESAREAN SECTION     emergent at 8 mos.    CESAREAN SECTION      Family History  Problem Relation Age of Onset   Diabetes Maternal Grandmother     Social History   Socioeconomic History   Marital status: Media planner    Spouse name: Not on file   Number of children: Not on file   Years of education: Not on file   Highest education level: Not on file  Occupational History   Not on file  Tobacco Use   Smoking status: Never   Smokeless tobacco: Never  Substance and Sexual Activity   Alcohol use: No   Drug use: No   Sexual activity: Yes  Other Topics Concern   Not on file  Social History Narrative   Not on file   Social Determinants of Health   Financial Resource Strain: Not on file  Food Insecurity: Not on file  Transportation Needs: Not on file  Physical Activity: Not on file  Stress: Not on file  Social Connections: Not on file  Intimate Partner Violence: Not on file    ROS Review of Systems  Constitutional:  Negative for chills and fever.  HENT: Negative.    Eyes: Negative.   Respiratory:  Negative for shortness of breath.   Cardiovascular:  Negative for chest pain.  Gastrointestinal:  Negative for abdominal pain, nausea and vomiting.  Endocrine: Negative.   Genitourinary:  Negative for dysuria and vaginal discharge.  Musculoskeletal: Negative.   Skin: Negative.   Allergic/Immunologic: Negative.   Neurological: Negative.    Hematological: Negative.   Psychiatric/Behavioral: Negative.     Objective:   Today's Vitals: BP 115/74 (BP Location: Left Arm, Patient Position: Sitting, Cuff Size: Normal)    Pulse 78  Temp 98.2 F (36.8 C) (Oral)    Resp 18    Ht 5\' 1"  (1.549 m)    Wt 169 lb (76.7 kg)    LMP  (LMP Unknown)    SpO2 98%    BMI 31.93 kg/m   Physical Exam Vitals and nursing note reviewed.  Constitutional:      Appearance: Normal appearance.  HENT:     Head: Normocephalic and atraumatic.     Right Ear: External ear normal.     Left Ear: External ear normal.     Nose: Nose normal.     Mouth/Throat:     Mouth: Mucous membranes are moist.     Pharynx: Oropharynx is clear.  Eyes:     Extraocular Movements: Extraocular movements intact.     Conjunctiva/sclera: Conjunctivae normal.     Pupils: Pupils are equal, round, and reactive to light.  Cardiovascular:     Rate and Rhythm: Normal rate and regular rhythm.     Pulses: Normal pulses.     Heart sounds: Normal heart sounds.  Pulmonary:     Breath sounds: Normal breath sounds.  Chest:     Comments: Breast exam deferred by patient  Musculoskeletal:        General: Normal range of motion.     Cervical back: Normal range of motion and neck supple.  Skin:    General: Skin is warm and dry.  Neurological:     General: No focal deficit present.     Mental Status: She is alert and oriented to person, place, and time.  Psychiatric:        Mood and Affect: Mood normal.        Behavior: Behavior normal.        Judgment: Judgment normal.    Assessment & Plan:   Problem List Items Addressed This Visit       Other   Liver lesion   Relevant Orders   MR Abdomen W Wo Contrast   Leukocytosis   Relevant Orders   CBC with Differential/Platelet (Completed)   TSH (Completed)   Class 1 obesity due to excess calories without serious comorbidity with body mass index (BMI) of 31.0 to 31.9 in adult   Other Visit Diagnoses     Breast pain    -  Primary    Relevant Orders   MM DIGITAL SCREENING BILATERAL   Encounter for HCV screening test for low risk patient       Relevant Orders   HCV Ab w Reflex to Quant PCR (Completed)   Interpretation: (Completed)       Outpatient Encounter Medications as of 05/21/2021  Medication Sig   [DISCONTINUED] famotidine (PEPCID) 20 MG tablet Take 1 tablet (20 mg total) by mouth 2 (two) times daily.   [DISCONTINUED] omeprazole (PRILOSEC) 20 MG capsule Take 1 capsule (20 mg total) by mouth daily.   [DISCONTINUED] ondansetron (ZOFRAN-ODT) 4 MG disintegrating tablet Take 1 tablet (4 mg total) by mouth every 8 (eight) hours as needed for nausea or vomiting.   [DISCONTINUED] sucralfate (CARAFATE) 1 GM/10ML suspension Take 10 mLs (1 g total) by mouth 4 (four) times daily -  with meals and at bedtime.   No facility-administered encounter medications on file as of 05/21/2021.  1. Breast pain Scholarship filled out on patient's behalf. Patient deferred breast exam today, note from emergency department breast exam Bilateral breast without erythema, warmth, swelling.  Bilateral breast do have a nodular feeling on palpation as discussed in physical exam  however no peau d'orange appearance.   Patient given application for [place] financial assistance, patient given appointment to establish care at [facility]. - MM DIGITAL SCREENING BILATERAL; Future  2. Liver lesion IMPRESSION: 1. No acute finding in the abdomen or pelvis.  Normal appendix. 2. Ill-defined 1.5 cm hyperenhancing lesion in the liver is indeterminate but may reflect a hemangioma. Recommend nonemergent outpatient MRI of the abdomen with and without contrast for characterization.     Electronically Signed   By: Valetta Mole M.D.   On: 04/16/2021 09:08   - MR Abdomen W Wo Contrast; Future  3. Leukocytosis, unspecified type  - CBC with Differential/Platelet - TSH  4. Encounter for HCV screening test for low risk patient  - HCV  Ab w Reflex to Quant PCR   5. Class 1 obesity due to excess calories without serious comorbidity with body mass index (BMI) of 31.0 to 31.9 in adult   I have reviewed the patient's medical history (PMH, PSH, Social History, Family History, Medications, and allergies) , and have been updated if relevant. I spent 30 minutes reviewing chart and  face to face time with patient.    Follow-up: Return for she needs PCP here 1-2 mo; she needs CAFA please .   Loraine Grip Mayers, PA-C

## 2021-05-21 NOTE — Patient Instructions (Signed)
I have started orders for you to have an MRI of your liver, and a mammogram to help evaluate your breast pain.  We will call you with today's lab results  Kennieth Rad, PA-C Physician Assistant [facility] [email or web address]   Dolor a la palpacin de las mamas Breast Tenderness El dolor a la palpacin de las mamas es un problema frecuente en las mujeres de todas las edades, pero tambin puede ocurrir en los hombres. El dolor a la palpacin de las mamas puede oscilar desde molestias leves hasta un dolor intenso. En las mujeres, el dolor generalmente es intermitente y se relaciona con el ciclo menstrual, pero puede ser constante. Son [employer] las causas posibles del dolor a la palpacin de las [place], incluidos los cambios hormonales, infecciones y el uso de ciertos medicamentos. Puede someterse a pruebas como una mamografa o una ecografa, para Copywriter, advertising inusuales. Por lo general, el dolor a la palpacin de las mamas no significa que usted Primary school teacher de mama. Siga estas instrucciones en su casa: Control del dolor y de las molestias  Si se lo indican, aplique hielo sobre la zona dolorida. Para hacer esto: Ponga el hielo en una bolsa plstica. Coloque una toalla entre la piel y Therapist, nutritional. Coloque el hielo durante 20 minutos, 2 a 3 veces por da. Use un sostn de soporte, especialmente al hacer actividad fsica. Quizs tambin desee usar ese sostn para dormir, si siente las [employer]. Medicamentos Use los medicamentos de venta libre y los recetados solamente como se lo haya indicado el mdico. Si el dolor es causado por alguna infeccin, posiblemente le receten un antibitico. Si le recetaron un antibitico, tmelo o selo como se lo haya indicado el mdico. No deje de tomar los antibiticos aunque comience a Sports administrator. Comida y bebida El mdico puede recomendarle disminuir el consumo de grasa en su dieta. Puede  hacer lo siguiente: Limite el consumo de alimentos fritos. A la hora de cocinarlos, opte por hornearlos, hervirlos, grillarlos y asarlos a Administrator, arts. Disminuya la cantidad de cafena de su dieta. En cambio, beba ms agua y elija bebidas sin cafena. Instrucciones generales  Lleve un registro de los das y las horas cuando tiene mayor sensibilidad en las [place]. Pregntele al mdico cmo debe hacerse los exmenes de mamas en su casa. Esto la ayudar a notar si tiene algn crecimiento o bulto fuera de lo normal. Concurra a todas las visitas de seguimiento como se lo haya indicado el mdico. Esto es importante. Comunquese con un mdico si: Cualquier zona de la mama est dura, enrojecida y caliente al tacto. Puede ser un signo de infeccin. Es Art therapist y: No est en etapa de lactancia y [employer] lquido de los pezones, especialmente sangre o pus. Tiene un bulto nuevo o doloroso en la mama que no desaparece despus de la finalizacin del perodo menstrual. Tiene fiebre. El dolor no mejora o [place]. El Airline pilot impide realizar las actividades cotidianas. Resumen El dolor a la palpacin de las mamas puede oscilar desde molestias leves hasta un dolor intenso. Son [employer] las causas posibles del dolor a la palpacin de las [place], incluidos los cambios hormonales, infecciones y el uso de ciertos medicamentos. Puede tratarse con hielo, el uso un sostn de soporte y [employer]. Haga cambios en la dieta como se lo haya indicado el mdico. Esta informacin no tiene Marine scientist el consejo del mdico. Asegrese de hacerle al mdico cualquier pregunta que tenga. Document Revised: 09/16/2018 Document  Reviewed: 09/16/2018 Elsevier Patient Education  2022 [employer].

## 2021-05-22 LAB — CBC WITH DIFFERENTIAL/PLATELET
Basophils Absolute: 0 10*3/uL (ref 0.0–0.2)
Basos: 0 %
EOS (ABSOLUTE): 0.2 10*3/uL (ref 0.0–0.4)
Eos: 2 %
Hematocrit: 39.7 % (ref 34.0–46.6)
Hemoglobin: 13.1 g/dL (ref 11.1–15.9)
Immature Grans (Abs): 0 10*3/uL (ref 0.0–0.1)
Immature Granulocytes: 0 %
Lymphocytes Absolute: 2.6 10*3/uL (ref 0.7–3.1)
Lymphs: 30 %
MCH: 29.8 pg (ref 26.6–33.0)
MCHC: 33 g/dL (ref 31.5–35.7)
MCV: 90 fL (ref 79–97)
Monocytes Absolute: 0.6 10*3/uL (ref 0.1–0.9)
Monocytes: 7 %
Neutrophils Absolute: 5.2 10*3/uL (ref 1.4–7.0)
Neutrophils: 61 %
Platelets: 322 10*3/uL (ref 150–450)
RBC: 4.39 x10E6/uL (ref 3.77–5.28)
RDW: 13 % (ref 11.7–15.4)
WBC: 8.6 10*3/uL (ref 3.4–10.8)

## 2021-05-22 LAB — TSH: TSH: 0.596 u[IU]/mL (ref 0.450–4.500)

## 2021-05-22 LAB — HCV INTERPRETATION

## 2021-05-22 LAB — HCV AB W REFLEX TO QUANT PCR: HCV Ab: NONREACTIVE

## 2021-05-23 ENCOUNTER — Other Ambulatory Visit: Payer: Self-pay

## 2021-05-23 ENCOUNTER — Telehealth: Payer: Self-pay

## 2021-05-23 ENCOUNTER — Ambulatory Visit (HOSPITAL_COMMUNITY)
Admission: RE | Admit: 2021-05-23 | Discharge: 2021-05-23 | Disposition: A | Discharge status: Home / Self Care | Payer: Self-pay | Source: Ambulatory Visit | Attending: Physician Assistant | Admitting: Physician Assistant

## 2021-05-23 DIAGNOSIS — E66811 Obesity, class 1: Secondary | ICD-10-CM | POA: Insufficient documentation

## 2021-05-23 DIAGNOSIS — K769 Liver disease, unspecified: Secondary | ICD-10-CM | POA: Insufficient documentation

## 2021-05-23 DIAGNOSIS — E6609 Other obesity due to excess calories: Secondary | ICD-10-CM | POA: Insufficient documentation

## 2021-05-23 DIAGNOSIS — D72829 Elevated white blood cell count, unspecified: Secondary | ICD-10-CM | POA: Insufficient documentation

## 2021-05-23 MED ORDER — GADOBUTROL 1 MMOL/ML IV SOLN
7.0000 mL | Freq: Once | INTRAVENOUS | Status: AC | PRN
  Administered 2021-05-23: 7 mL via INTRAVENOUS

## 2021-05-23 MED ORDER — EPINEPHRINE 0.3 MG/0.3ML IJ SOAJ
INTRAMUSCULAR | Status: AC
  Filled 2021-05-23: qty 0.3

## 2021-05-23 MED ORDER — DIPHENHYDRAMINE HCL 25 MG PO CAPS
ORAL_CAPSULE | ORAL | Status: AC
  Filled 2021-05-23: qty 1

## 2021-05-23 MED ORDER — DIPHENHYDRAMINE HCL 50 MG/ML IJ SOLN: 50.0000 mg | Freq: Once | INTRAMUSCULAR | Status: AC

## 2021-05-23 MED ORDER — DIPHENHYDRAMINE HCL 50 MG/ML IJ SOLN
INTRAMUSCULAR | Status: AC
  Administered 2021-05-23: 50 mg
  Filled 2021-05-23: qty 1

## 2021-05-23 NOTE — Significant Event (Signed)
Rapid Response Event Note  ? ?Reason for Call :  ?Patient developed diffuse red rash, diffuse itching, itchy throat and shortness of breath following MRI where she received IV contrast.  ? ?Initial Focused Assessment:  ?Pt was sitting in chair, assisted to stretcher. She endorses feeling itchy all over and skin is reddened with rash on her face, chest and arms. Pt becomes increasingly anxious and begins complaining of difficulty breathing. She maintains SpO2 >92% on room air. Deep breathing technique and emotional support used to help calm pt and 50 mg IV Benadryl given for presumed contrast reaction.  ? ?Pt redness and rash quickly improving following IV Benadryl administration. Anxiety subsided shortly after IV Benadryl administration.  ? ?Pt swallow tested and pt was able to drink water without issue or difficulty swallowing.  ? ?VS: BP 124/77, HR 106, RR 16, SpO2 93% on room air ? ?Interventions:  ?-Established IV access ?-50 mg IV Benedryl ? ?Event Summary:  ?MD Notified: Dr. Chilton Si ?Call Time: 1336 ?Arrival Time: 1340 ?End Time: 1415 ? ?Jennye Moccasin, RN ?

## 2021-05-23 NOTE — Telephone Encounter (Signed)
Pt asked about Imaging results (if available) and labwork from 05/21/21 visit. Thank you ?

## 2021-05-24 ENCOUNTER — Telehealth: Payer: Self-pay | Admitting: *Deleted

## 2021-05-24 NOTE — Telephone Encounter (Signed)
-----   Message from Cari S Mayers, PA-C sent at 05/23/2021 10:01 AM EST ----- ?Please call patient and let her know that her thyroid function is within normal limits, her white blood cell count is back within normal limits.  Her screening for hepatitis C was negative. ?

## 2021-05-24 NOTE — Telephone Encounter (Signed)
-----   Message from Roney Jaffe, PA-C sent at 05/23/2021 10:01 AM EST ----- ?Please call patient and let her know that her thyroid function is within normal limits, her white blood cell count is back within normal limits.  Her screening for hepatitis C was negative. ?

## 2021-05-24 NOTE — Telephone Encounter (Signed)
Medical Assistant used [employer] to contact patient.  ?Interpreter Name: Jodi Marble Interpreter #: [phone] ?Patient is aware of labs being normal and returning to normal with screening being negative. Patient is aware fatty liver being noted with a recommended repeat in 12 months. Patient advised to adhere to low cholesterol diet and move her body at least 3 times a week for 30 minutes. ?

## 2021-05-24 NOTE — Telephone Encounter (Signed)
Medical Assistant used [employer] to contact patient.  ?Interpreter Name: Ricki Rodriguez Interpreter #: [id number] ?Patient was not available, [employer] left patient a voicemail. ?Voicemail states to give a call back to [place] with [facility] at [phone] ?

## 2021-06-19 ENCOUNTER — Other Ambulatory Visit: Payer: Self-pay

## 2021-06-19 DIAGNOSIS — N644 Mastodynia: Secondary | ICD-10-CM

## 2021-06-26 ENCOUNTER — Telehealth: Payer: Self-pay

## 2021-06-26 NOTE — Telephone Encounter (Signed)
Spoke with patient in regards to the patient's upcoming BCCCP appointment. Patient complains of bilateral diffuse breast pain. Pain comes and goes. Patient complained of having a fever on and off for the past week but no redness of the breast or warmth to the touch. Based on the information presented by the patient and per Fonnie Mu patient can wait until June to be seen in BCCCP. Gave patient instructions to call back if any new symptoms appear such as: (redness of breast, warmth to the touch, hardened lump or any discharge from breast). Patient voiced understanding.  ?

## 2021-06-28 ENCOUNTER — Encounter: Payer: Self-pay | Admitting: Family Medicine

## 2021-06-28 ENCOUNTER — Ambulatory Visit (INDEPENDENT_AMBULATORY_CARE_PROVIDER_SITE_OTHER): Payer: Self-pay | Admitting: Family Medicine

## 2021-06-28 VITALS — BP 100/68 | HR 82 | Temp 98.2°F | Resp 16 | Ht 60.5 in | Wt 167.0 lb

## 2021-06-28 DIAGNOSIS — Z789 Other specified health status: Secondary | ICD-10-CM

## 2021-06-28 DIAGNOSIS — N644 Mastodynia: Secondary | ICD-10-CM

## 2021-06-28 MED ORDER — IBUPROFEN 600 MG PO TABS: 600.0000 mg | ORAL_TABLET | Freq: Three times a day (TID) | ORAL | 2 refills | Status: AC | PRN

## 2021-06-28 NOTE — Progress Notes (Signed)
? ?  Established Patient Office Visit ? ?Subjective:  ?Patient ID: Denise Avery, female    DOB: 1996/01/26  Age: 26 y.o. MRN: [id number] ? ?CC:  ?Chief Complaint  ?Patient presents with  ? Follow-up  ? ? ?HPI ?Denise Avery presents for follow up of breast pain. She has not been taking any meds for sx except tylenol. She has a mammogram scheduled. This visit was aided by an interpreter ? ?Past Medical History:  ?Diagnosis Date  ? Mastitis   ? Positive TB test   ? CXR- Neg  ? ? ?Past Surgical History:  ?Procedure Laterality Date  ? CESAREAN SECTION    ? emergent at 8 mos.   ? CESAREAN SECTION    ? ? ?Family History  ?Problem Relation Age of Onset  ? Diabetes Maternal Grandmother   ? ? ?Social History  ? ?Socioeconomic History  ? Marital status: Media planner  ?  Spouse name: Not on file  ? Number of children: Not on file  ? Years of education: Not on file  ? Highest education level: Not on file  ?Occupational History  ? Not on file  ?Tobacco Use  ? Smoking status: Never  ? Smokeless tobacco: Never  ?Substance and Sexual Activity  ? Alcohol use: No  ? Drug use: No  ? Sexual activity: Yes  ?Other Topics Concern  ? Not on file  ?Social History Narrative  ? Not on file  ? ?Social Determinants of Health  ? ?Financial Resource Strain: Not on file  ?Food Insecurity: Not on file  ?Transportation Needs: Not on file  ?Physical Activity: Not on file  ?Stress: Not on file  ?Social Connections: Not on file  ?Intimate Partner Violence: Not on file  ? ? ?ROS ?Review of Systems  ?All other systems reviewed and are negative. ? ?Objective:  ? ?Today's Vitals: BP 100/68   Pulse 82   Temp 98.2 ?F (36.8 ?C) (Oral)   Resp 16   Ht 5' 0.5" (1.537 m)   Wt 167 lb (75.8 kg)   SpO2 98%   BMI 32.08 kg/m?  ? ?Physical Exam ?Vitals and nursing note reviewed.  ?Constitutional:   ?   General: She is not in acute distress. ?Cardiovascular:  ?   Rate and Rhythm: Normal rate and regular rhythm.  ?Pulmonary:  ?   Effort: Pulmonary  effort is normal.  ?   Breath sounds: Normal breath sounds.  ?Chest:  ?Breasts: ?   Right: Tenderness present. No nipple discharge.  ?   Left: Tenderness present. No nipple discharge.  ?   Comments: Multiple cystic lesions in both breasts ?Neurological:  ?   Mental Status: She is alert.  ? ? ?Assessment & Plan:  ? ?1. Breast pain ??fibrocystic  - Keep scheduled mammogram appt. Ibuprofen prescribed.  ? ?2. Language barrier to communication ?  ? ? ?Outpatient Encounter Medications as of 06/28/2021  ?Medication Sig  ? ibuprofen (ADVIL) 600 MG tablet Take 1 tablet (600 mg total) by mouth every 8 (eight) hours as needed.  ? ?No facility-administered encounter medications on file as of 06/28/2021.  ? ? ?Follow-up: No follow-ups on file.  ? ?Tommie Raymond, MD ? ?

## 2021-06-28 NOTE — Progress Notes (Signed)
Patient is  here for follow-up breast pain. Patient said pain is 8/10 and with here feeling multiple lumps on both sides. Patient has appt 06/06 for breast mammogram .  ?

## 2021-06-29 ENCOUNTER — Ambulatory Visit: Payer: Self-pay | Admitting: Family Medicine

## 2021-08-28 ENCOUNTER — Other Ambulatory Visit: Payer: Self-pay

## 2021-08-28 ENCOUNTER — Ambulatory Visit: Payer: Self-pay

## 2021-09-07 ENCOUNTER — Ambulatory Visit: Payer: Self-pay | Admitting: Family Medicine

## 2021-10-03 ENCOUNTER — Ambulatory Visit: Payer: Self-pay

## 2021-11-29 ENCOUNTER — Ambulatory Visit: Payer: Self-pay | Admitting: *Deleted

## 2021-11-29 DIAGNOSIS — N644 Mastodynia: Secondary | ICD-10-CM

## 2021-11-29 DIAGNOSIS — Z1239 Encounter for other screening for malignant neoplasm of breast: Secondary | ICD-10-CM

## 2021-11-29 NOTE — Progress Notes (Signed)
[name] is a 26 y.o. female who presents to [facility] clinic today with complaint of bilateral breast lumps x 6 months since Nexplanon was removed. Patient states there is 3-4 lumps that occur within each breast monthly around the time of her menstrual cycle. Patient stated the her breasts are more swollen during this time and the veins more prominent. Patient stated that the areas of the lumps are painful to the touch when occur. Patient denied the lumps and pain today.    Pap Smear: Pap smear not completed today. Last Pap smear was 05/30/2020 at the [facility] clinic and was normal. Per patient has no history of an abnormal Pap smear. Last Pap smear result is not available in Epic. Last Pap smear result will be scanned into Epic.   Physical exam: Breasts Breasts symmetrical. No skin abnormalities bilateral breasts. No nipple retraction bilateral breasts. No nipple discharge bilateral breasts. No lymphadenopathy. No lumps palpated bilateral breasts. Complaints of left outer and lower breast pain on exam.      Pelvic/Bimanual Pap is not indicated today per BCCCP guidelines.   Smoking History: Patient has never smoked.   Patient Navigation: Patient education provided. Access to services provided for patient through [place] program. Spanish interpreter Natale Lay from [facility] provided.    Breast and Cervical Cancer Risk Assessment: Patient does not have family history of breast cancer, known genetic mutations, or radiation treatment to the chest before age 55. Patient does not have history of cervical dysplasia, immunocompromised, or DES exposure in-utero. Breast cancer risk assessment completed. No breast cancer risk calculated due to patient is less than 57 years old.  Risk Assessment     Risk Scores       11/29/2021   Last edited by: Meryl Dare, CMA   5-year risk:    Lifetime risk:             A: BCCCP exam without pap smear Complaint of  bilateral breast lumps, pain, and swelling.  P: Referred patient to the [facility] for a bilateral breast ultrasound. Appointment scheduled Friday, December 14, 2021 at 0850.  Priscille Heidelberg, RN 11/29/2021 12:58 PM

## 2021-11-29 NOTE — Patient Instructions (Signed)
Explained breast self awareness with Denise Avery. Patient did not need a Pap smear today due to last Pap smear was 05/30/2020. Let her know BCCCP will cover Pap smears every 3 years unless has a history of abnormal Pap smears. Referred patient to the [facility] for a bilateral breast ultrasound. Appointment scheduled Friday, December 14, 2021 at 0850. Patient aware of appointment and will be there. Denise Avery verbalized understanding.  Nastasha Reising, Kathaleen Maser, RN 12:58 PM

## 2021-12-03 ENCOUNTER — Other Ambulatory Visit: Payer: Self-pay

## 2021-12-14 ENCOUNTER — Ambulatory Visit
Admission: RE | Admit: 2021-12-14 | Discharge: 2021-12-14 | Disposition: A | Discharge status: Home / Self Care | Payer: Self-pay | Source: Ambulatory Visit | Attending: Obstetrics and Gynecology | Admitting: Obstetrics and Gynecology

## 2021-12-14 ENCOUNTER — Ambulatory Visit
Admission: RE | Admit: 2021-12-14 | Discharge: 2021-12-14 | Disposition: A | Discharge status: Home / Self Care | Payer: No Typology Code available for payment source | Source: Ambulatory Visit | Attending: Obstetrics and Gynecology | Admitting: Obstetrics and Gynecology

## 2021-12-14 DIAGNOSIS — N644 Mastodynia: Secondary | ICD-10-CM
